# Patient Record
Sex: Male | Born: 1995 | Race: Black or African American | Hispanic: No | Marital: Single | Smoking: Current every day smoker
Health system: Southern US, Community
[De-identification: ages and names within clinical notes are randomized; demographics above are authoritative.]

## PROBLEM LIST (undated history)

## (undated) DIAGNOSIS — B009 Herpesviral infection, unspecified: Secondary | ICD-10-CM

## (undated) DIAGNOSIS — F419 Anxiety disorder, unspecified: Secondary | ICD-10-CM

## (undated) DIAGNOSIS — F32A Depression, unspecified: Secondary | ICD-10-CM

## (undated) DIAGNOSIS — K219 Gastro-esophageal reflux disease without esophagitis: Secondary | ICD-10-CM

## (undated) DIAGNOSIS — J449 Chronic obstructive pulmonary disease, unspecified: Secondary | ICD-10-CM

## (undated) HISTORY — DX: Gastro-esophageal reflux disease without esophagitis: K21.9

## (undated) HISTORY — DX: Chronic obstructive pulmonary disease, unspecified: J44.9

## (undated) HISTORY — DX: Depression, unspecified: F32.A

---

## 2018-02-28 ENCOUNTER — Ambulatory Visit (INDEPENDENT_AMBULATORY_CARE_PROVIDER_SITE_OTHER): Payer: Self-pay | Admitting: Family Medicine

## 2018-02-28 ENCOUNTER — Encounter: Payer: Self-pay | Admitting: Family Medicine

## 2018-02-28 VITALS — BP 135/90 | HR 78 | Resp 17 | Ht 63.0 in | Wt 125.0 lb

## 2018-02-28 DIAGNOSIS — Z7689 Persons encountering health services in other specified circumstances: Secondary | ICD-10-CM

## 2018-02-28 DIAGNOSIS — Z113 Encounter for screening for infections with a predominantly sexual mode of transmission: Secondary | ICD-10-CM

## 2018-02-28 DIAGNOSIS — Z202 Contact with and (suspected) exposure to infections with a predominantly sexual mode of transmission: Secondary | ICD-10-CM

## 2018-02-28 MED ORDER — CEFTRIAXONE SODIUM 250 MG IJ SOLR
250.0000 mg | Freq: Once | INTRAMUSCULAR | Status: AC
  Administered 2018-02-28: 250 mg via INTRAMUSCULAR

## 2018-02-28 MED ORDER — AZITHROMYCIN 500 MG PO TABS
1000.0000 mg | ORAL_TABLET | Freq: Once | ORAL | Status: AC
  Administered 2018-02-28: 1000 mg via ORAL

## 2018-02-28 NOTE — Patient Instructions (Addendum)
Thank you for choosing Primary Care at [facility] to be your medical home!    William Caldwell was seen by William Courts, William Caldwell today.   William Caldwell's primary care provider is William Neighbors, William Caldwell.   For the best care possible, you should try to see William Courts, William Caldwell whenever you come to the clinic.   We look forward to seeing you again soon!  If you have any questions about your visit today, please call us at [phone] or feel free to reach your primary care provider via MyChart.     Sexually Transmitted Disease A sexually transmitted disease (STD) is a disease or infection that may be passed (transmitted) from person to person, usually during sexual activity. This may happen by way of saliva, semen, blood, vaginal mucus, or urine. Common STDs include:  Gonorrhea.  Chlamydia.  Syphilis.  HIV and AIDS.  Genital herpes.  Hepatitis B and C.  Trichomonas.  Human papillomavirus (HPV).  Pubic lice.  Scabies.  Mites.  Bacterial vaginosis.  What are the causes? An STD may be caused by bacteria, a virus, or parasites. STDs are often transmitted during sexual activity if one person is infected. However, they may also be transmitted through nonsexual means. STDs may be transmitted after:  Sexual intercourse with an infected person.  Sharing sex toys with an infected person.  Sharing needles with an infected person or using unclean piercing or tattoo needles.  Having intimate contact with the genitals, mouth, or rectal areas of an infected person.  Exposure to infected fluids during birth.  What are the signs or symptoms? Different STDs have different symptoms. Some people may not have any symptoms. If symptoms are present, they may include:  Painful or bloody urination.  Pain in the pelvis, abdomen, vagina, anus, throat, or eyes.  A skin rash, itching, or irritation.  Growths, ulcerations, blisters, or sores in the genital and anal areas.  Abnormal  vaginal discharge with or without bad odor.  Penile discharge in men.  Fever.  Pain or bleeding during sexual intercourse.  Swollen glands in the groin area.  Yellow skin and eyes (jaundice). This is seen with hepatitis.  Swollen testicles.  Infertility.  Sores and blisters in the mouth.  How is this diagnosed? To make a diagnosis, your health care provider may:  Take a medical history.  Perform a physical exam.  Take a sample of any discharge to examine.  Swab the throat, cervix, opening to the penis, rectum, or vagina for testing.  Test a sample of your first morning urine.  Perform blood tests.  Perform a Pap test, if this applies.  Perform a colposcopy.  Perform a laparoscopy.  How is this treated? Treatment depends on the STD. Some STDs may be treated but not cured.  Chlamydia, gonorrhea, trichomonas, and syphilis can be cured with antibiotic medicine.  Genital herpes, hepatitis, and HIV can be treated, but not cured, with prescribed medicines. The medicines lessen symptoms.  Genital warts from HPV can be treated with medicine or by freezing, burning (electrocautery), or surgery. Warts may come back.  HPV cannot be cured with medicine or surgery. However, abnormal areas may be removed from the cervix, vagina, or vulva.  If your diagnosis is confirmed, your recent sexual partners need treatment. This is true even if they are symptom-free or have a negative culture or evaluation. They should not have sex until their health care providers say it is okay.  Your health care provider may test you for  infection again 3 months after treatment.  How is this prevented? Take these steps to reduce your risk of getting an STD:  Use latex condoms, dental dams, and water-soluble lubricants during sexual activity. Do not use petroleum jelly or oils.  Avoid having multiple sex partners.  Do not have sex with someone who has other sex partners.  Do not have sex with  anyone you do not know or who is at high risk for an STD.  Avoid risky sex practices that can break your skin.  Do not have sex if you have open sores on your mouth or skin.  Avoid drinking too much alcohol or taking illegal drugs. Alcohol and drugs can affect your judgment and put you in a vulnerable position.  Avoid engaging in oral and anal sex acts.  Get vaccinated for HPV and hepatitis. If you have not received these vaccines in the past, talk to your health care provider about whether one or both might be right for you.  If you are at risk of being infected with HIV, it is recommended that you take a prescription medicine daily to prevent HIV infection. This is called pre-exposure prophylaxis (PrEP). You are considered at risk if: ? You are a man who has sex with other men (MSM). ? You are a heterosexual man or woman and are sexually active with more than one partner. ? You take drugs by injection. ? You are sexually active with a partner who has HIV.  Talk with your health care provider about whether you are at high risk of being infected with HIV. If you choose to begin PrEP, you should first be tested for HIV. You should then be tested every 3 months for as long as you are taking PrEP.  Contact a health care provider if:  See your health care provider.  Tell your sexual partner(s). They should be tested and treated for any STDs.  Do not have sex until your health care provider says it is okay. Get help right away if: Contact your health care provider right away if:  You have severe abdominal pain.  You are a man and notice swelling or pain in your testicles.  You are a woman and notice swelling or pain in your vagina.  This information is not intended to replace advice given to you by your health care provider. Make sure you discuss any questions you have with your health care provider. Document Released: 05/28/2002 Document Revised: 09/25/2015 Document Reviewed:  09/25/2012 Elsevier Interactive Patient Education  2018 [employer]

## 2018-02-28 NOTE — Progress Notes (Signed)
William Caldwell, is a 22 y.o. male  [id number]:[id number]:[id number]  [id number]:[id number]  DOB - 21-Jul-1995  CC:  Chief Complaint  Patient presents with  . Establish Care  . Exposure to STD       HPI: William Caldwell is a 22 y.o. male is here today to establish care.   William Caldwell does not have a problem list on file.    Today's visit:  William Caldwell is concern for STD. Reports exposure to Chlamydia and wishes to be treated.   Initially declined in front of partner HIV and RPR testing today. However, while leaving urine specimen, opted to have additional screenings completed. Asymptomatic. Partner tested positive for Chlamydia 3 days ago and was already treated at the health department. He complains of dysuria and genital itching.  Patient denies new headaches, chest pain, abdominal pain, nausea, new weakness , numbness or tingling, SOB, edema, or worrisome cough.   Current medications:No current outpatient medications on file.  Current Facility-Administered Medications:  .  azithromycin (ZITHROMAX) tablet 1,000 mg, 1,000 mg, Oral, Once, Tiburcio PeaHarris, Godfrey PickKimberly S, FNP .  cefTRIAXone (ROCEPHIN) injection 250 mg, 250 mg, Intramuscular, Once, Tiburcio PeaHarris, Godfrey PickKimberly S, FNP   Pertinent family medical history: family history is not on file.   No Known Allergies  Social History   Socioeconomic History  . Marital status: Single    Spouse name: Not on file  . Number of children: Not on file  . Years of education: Not on file  . Highest education level: Not on file  Occupational History  . Not on file  Social Needs  . Financial resource strain: Not on file  . Food insecurity:    Worry: Not on file    Inability: Not on file  . Transportation needs:    Medical: Not on file    Non-medical: Not on file  Tobacco Use  . Smoking status: Never Smoker  . Smokeless tobacco: Never Used  Substance and Sexual Activity  . Alcohol use: Not Currently    Frequency: Never  . Drug use: Never  . Sexual activity: Not on file   Lifestyle  . Physical activity:    Days per week: Not on file    Minutes per session: Not on file  . Stress: Not on file  Relationships  . Social connections:    Talks on phone: Not on file    Gets together: Not on file    Attends religious service: Not on file    Active member of club or organization: Not on file    Attends meetings of clubs or organizations: Not on file    Relationship status: Not on file  . Intimate partner violence:    Fear of current or ex partner: Not on file    Emotionally abused: Not on file    Physically abused: Not on file    Forced sexual activity: Not on file  Other Topics Concern  . Not on file  Social History Narrative  . Not on file    Review of Systems: Pertinent negatives listed in HPI  Objective:   Vitals:   02/28/18 1501  BP: 135/90  Pulse: 78  Resp: 17  SpO2: 98%    BP Readings from Last 3 Encounters:  02/28/18 135/90    Filed Weights   02/28/18 1501  Weight: 125 lb (56.7 kg)      Physical Exam: Constitutional: Patient appears well-developed and well-nourished. No distress. HENT: Normocephalic, atraumatic, External right and left ear normal. Oropharynx is clear and moist.  Eyes: Conjunctivae and EOM are normal. PERRLA, no scleral icterus. Neck: Normal ROM. Neck supple. No JVD. No tracheal deviation. No thyromegaly. Pulmonary: Effort and breath sounds normal, no stridor, rhonchi, wheezes, rales.  Abdominal: Soft. BS +, no distension, tenderness, rebound or guarding.  Musculoskeletal: Normal range of motion. No edema and no tenderness.  Skin: Skin is warm and dry. No rash noted. Not diaphoretic. No erythema. No pallor. Psychiatric: Normal mood and affect. Behavior, judgment, thought content normal.    Assessment and plan:  1. Encounter to establish care   2. STD exposure - GC/Chlamydia Probe Amp Treated with Rocephin 250 mg IM and Azithromycin 1,000 mg Avoid sexual intercourse for at least 3 weeks. Use barrier  protection.   Meds ordered this encounter  Medications  . azithromycin (ZITHROMAX) tablet 1,000 mg  . cefTRIAXone (ROCEPHIN) injection 250 mg   Orders Placed This Encounter  Procedures  . GC/Chlamydia Probe Amp  . HIV antibody (with reflex)  . RPR  . HSV(herpes simplex vrs) 1+2 ab-IgG    Follow-up as needed.  The patient was given clear instructions to go to ER or return to medical center if symptoms don't improve, worsen or new problems develop. The patient verbalized understanding. The patient was advised  to call and obtain lab results if they haven't heard anything from out office within 7-10 business days.   Joaquin Courts, FNP Primary Care at [facility] [address], [place] [address] [phone]: [phone]    This note has been created with Dragon speech recognition software and Paediatric nurse. Any transcriptional errors are unintentional.

## 2018-03-07 ENCOUNTER — Telehealth: Payer: Self-pay | Admitting: Family Medicine

## 2018-03-07 LAB — RPR, QUANT+TP ABS (REFLEX)
Rapid Plasma Reagin, Quant: 1:1 {titer} — ABNORMAL HIGH
T Pallidum Abs: NONREACTIVE

## 2018-03-07 LAB — HSV(HERPES SIMPLEX VRS) I + II AB-IGG
HSV 1 Glycoprotein G Ab, IgG: 37.3 index — ABNORMAL HIGH (ref 0.00–0.90)
HSV 2 IGG, TYPE SPEC: 1.82 {index} — AB (ref 0.00–0.90)

## 2018-03-07 LAB — HIV ANTIBODY (ROUTINE TESTING W REFLEX): HIV Screen 4th Generation wRfx: NONREACTIVE

## 2018-03-07 LAB — HSV-2 IGG SUPPLEMENTAL TEST: HSV-2 IgG Supplemental Test: NEGATIVE

## 2018-03-07 LAB — RPR: RPR Ser Ql: REACTIVE — AB

## 2018-03-07 NOTE — Telephone Encounter (Signed)
Please contact [facility] to notify of positive syphilis result. I will contact patient have him go to the health department for treatment

## 2018-03-07 NOTE — Telephone Encounter (Signed)
Please try and contact patient tomorrow. If no response, mail unable to contact letter.

## 2018-03-07 NOTE — Telephone Encounter (Signed)
Communicable disease report [phone]([phone]).

## 2018-03-08 NOTE — Telephone Encounter (Signed)
Patient answered phone & then hung up. Sounded like he was still sleep. Will try to call back later.

## 2018-03-08 NOTE — Telephone Encounter (Signed)
Spoke with patient & went over lab results. Expressed importance of him going to the Health Department for treatment. Patient hung up on me.

## 2018-03-09 LAB — GC/CHLAMYDIA PROBE AMP
CHLAMYDIA, DNA PROBE: POSITIVE — AB
Neisseria gonorrhoeae by PCR: NEGATIVE

## 2018-03-09 NOTE — Telephone Encounter (Signed)
Health Department called to notify us that RPR was a false positive since the t. pallidum antibody was nonreactive.

## 2018-03-09 NOTE — Progress Notes (Signed)
Communicable Disease Report faxed to the Health Department.

## 2018-03-10 ENCOUNTER — Emergency Department (HOSPITAL_COMMUNITY)
Admission: EM | Admit: 2018-03-10 | Discharge: 2018-03-10 | Disposition: A | Discharge status: Home / Self Care | Payer: Self-pay | Attending: Emergency Medicine | Admitting: Emergency Medicine

## 2018-03-10 ENCOUNTER — Encounter (HOSPITAL_COMMUNITY): Payer: Self-pay | Admitting: Emergency Medicine

## 2018-03-10 ENCOUNTER — Other Ambulatory Visit: Payer: Self-pay

## 2018-03-10 DIAGNOSIS — A539 Syphilis, unspecified: Secondary | ICD-10-CM | POA: Insufficient documentation

## 2018-03-10 DIAGNOSIS — F17200 Nicotine dependence, unspecified, uncomplicated: Secondary | ICD-10-CM | POA: Insufficient documentation

## 2018-03-10 MED ORDER — PENICILLIN G BENZATHINE 1200000 UNIT/2ML IM SUSP
2.4000 10*6.[IU] | Freq: Once | INTRAMUSCULAR | Status: AC
  Administered 2018-03-10: 2.4 10*6.[IU] via INTRAMUSCULAR
  Filled 2018-03-10: qty 4

## 2018-03-10 NOTE — ED Provider Notes (Signed)
[facility]   CSN: [id number] Arrival date & time: 03/10/18  1322     History   Chief Complaint Chief Complaint  Patient presents with  . SEXUALLY TRANSMITTED DISEASE    HPI William Caldwell is a 22 y.o. male who presents for treatment of syphilis.  Patient states that he had his girlfriend was diagnosed with chlamydia.  He underwent testing which is available and review of the EMR and was told he needed to come in for treatment.  Patient had positive RPR titers upon review with negative HIV testing.  He denies any active symptoms at this time.  Patient states he was in prison but his been out for the past 6 months.  He states when he got out he had sex with multiple women but he has been in a monogamous relationship for the past 4 months.  Patient states that he did not have any intercourse while in prison and has sex only with women.  He denies fevers chills or rapid weight loss. HPI  History reviewed. No pertinent past medical history.  There are no active problems to display for this patient.   History reviewed. No pertinent surgical history.      Home Medications    Prior to Admission medications   Not on File    Family History No family history on file.  Social History Social History   Tobacco Use  . Smoking status: Current Every Day Smoker    Packs/day: 1.00  . Smokeless tobacco: Never Used  Substance Use Topics  . Alcohol use: Not Currently    Frequency: Never    Comment: occassional  . Drug use: Not Currently     Allergies   Patient has no known allergies.   Review of Systems Review of Systems  Ten systems reviewed and are negative for acute change, except as noted in the HPI.   Physical Exam Updated Vital Signs BP 131/86   Pulse 91   Temp 99.3 F (37.4 C) (Oral)   Resp 16   SpO2 98%   Physical Exam Vitals signs and nursing note reviewed.  Constitutional:      General: He is not in  acute distress.    Appearance: He is well-developed. He is not diaphoretic.  HENT:     Head: Normocephalic and atraumatic.  Eyes:     General: No scleral icterus.    Conjunctiva/sclera: Conjunctivae normal.  Neck:     Musculoskeletal: Normal range of motion and neck supple.  Cardiovascular:     Rate and Rhythm: Normal rate and regular rhythm.     Heart sounds: Normal heart sounds.  Pulmonary:     Effort: Pulmonary effort is normal. No respiratory distress.     Breath sounds: Normal breath sounds.  Abdominal:     Palpations: Abdomen is soft.     Tenderness: There is no abdominal tenderness.  Skin:    General: Skin is warm and dry.  Neurological:     Mental Status: He is alert.  Psychiatric:        Behavior: Behavior normal.      ED Treatments / Results  Labs (all labs ordered are listed, but only abnormal results are displayed) Labs Reviewed  HIV-1 RNA QUANT-NO REFLEX-BLD    EKG None  Radiology No results found.  Procedures Procedures (including critical care time)  Medications Ordered in ED Medications  penicillin g benzathine (BICILLIN LA) 1200000 UNIT/2ML injection 2.4 Million Units (2.4 Million Units  Intramuscular Given 03/10/18 1407)     Initial Impression / Assessment and Plan / ED Course  I have reviewed the triage vital signs and the nursing notes.  Pertinent labs & imaging results that were available during my care of the patient were reviewed by me and considered in my medical decision making (see chart for details).     Patient with positive syphilis.  Treated here with 2,400,000 units of penicillin G.  I discussed the case with Dr. Orvan Falconerampbell is on-call for infectious disease.  I ordered an HIV viral load for discussion with Dr. Orvan Falconerampbell as patient HIV antibody was negative however in the setting of acute infection he may not yet have developed antibodies.  This will be followed up with by infectious disease doctors.  We discussed safe sex practices.   Discussed return precautions and outpatient follow-up.  Patient appears appropriate for discharge at this time  Final Clinical Impressions(s) / ED Diagnoses   Final diagnoses:  None    ED Discharge Orders    None       Arthor CaptainHarris, Promyse Ardito, PA-C 03/10/18 1542    Doug SouJacubowitz, Sam, MD 03/10/18 [id number]

## 2018-03-10 NOTE — ED Triage Notes (Addendum)
Pt reports sudden weight loss starting 2 months ago, generalized weakness, abd pain, being told on Thursday that pt is positive for syphilis. Pt reports finishing tx for chlamydia on Wednesday.  Pt reports recent incarceration.

## 2018-03-10 NOTE — Discharge Instructions (Signed)
DO NOT HAVE UNPROTECTED INTERCOURSE INCLUDING TOUCHING ANOTHER PERSON'S GENITALS, ORAL, ANAL OR VAGINAL INTERCOURSE FOR THE NEXT 10 DAYS.   Get help right away if: You have severe chest pain. You have trouble walking or coordinating movements. You are confused. You lose vision or hearing. You have numbness in your arms or legs. You have a seizure. You faint. You have a severe headache that does not go away with medicine.  Free HIV and STD Testing These locations offer FREE confidential testing for HIV, Chlamydia, Gonorrhea, and Syphilis. Non-Traditional Testing Sites Address Telephone  [facility] [address], [place] [phone] Mondays 5pm - 7pm  [facility] [address] [place] [phone] Wednesdays 2pm-8pm  [facility] [address], [place] [phone] Thursdays 9am-12noon 1pm-4pm  [facility] [address], [place] [phone] Tuesdays Thursdays 9am-12noon 1pm-4pm  [facility] offers free, confidential testing and treatment for HIV, Chlamydia, Gonorrhea, Syphilis, Herpes, Bacterial Vaginosis, Yeast, and Trichomoniasis. Traditional Testing   [facility] [address], [place] [phone]  Monday thru Friday  Call for an appointment  [facility] [address] [place] 239 159 [phone] Monday thru Friday  Call for anappointment.  If you have any questions about this information please call [phone]. 01/27/2011

## 2018-03-11 LAB — HIV-1 RNA QUANT-NO REFLEX-BLD
HIV 1 RNA Quant: <20 copies/mL
LOG10 HIV-1 RNA: UNDETERMINED {Log_copies}/mL

## 2018-05-01 ENCOUNTER — Emergency Department (HOSPITAL_COMMUNITY)
Admission: EM | Admit: 2018-05-01 | Discharge: 2018-05-01 | Disposition: A | Discharge status: Home / Self Care | Payer: Self-pay | Attending: Emergency Medicine | Admitting: Emergency Medicine

## 2018-05-01 ENCOUNTER — Encounter (HOSPITAL_COMMUNITY): Payer: Self-pay

## 2018-05-01 DIAGNOSIS — F10120 Alcohol abuse with intoxication, uncomplicated: Secondary | ICD-10-CM | POA: Insufficient documentation

## 2018-05-01 DIAGNOSIS — Z5321 Procedure and treatment not carried out due to patient leaving prior to being seen by health care provider: Secondary | ICD-10-CM | POA: Insufficient documentation
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
## 2018-12-06 ENCOUNTER — Emergency Department (HOSPITAL_COMMUNITY): Payer: Self-pay

## 2018-12-06 ENCOUNTER — Emergency Department (HOSPITAL_COMMUNITY)
Admission: EM | Admit: 2018-12-06 | Discharge: 2018-12-06 | Disposition: A | Discharge status: Home / Self Care | Payer: Self-pay | Attending: Emergency Medicine | Admitting: Emergency Medicine

## 2018-12-06 ENCOUNTER — Encounter (HOSPITAL_COMMUNITY): Payer: Self-pay | Admitting: *Deleted

## 2018-12-06 DIAGNOSIS — F17210 Nicotine dependence, cigarettes, uncomplicated: Secondary | ICD-10-CM | POA: Insufficient documentation

## 2018-12-06 DIAGNOSIS — M79674 Pain in right toe(s): Secondary | ICD-10-CM | POA: Insufficient documentation

## 2018-12-06 MED ORDER — NAPROXEN 500 MG PO TABS: 500.0000 mg | ORAL_TABLET | Freq: Two times a day (BID) | ORAL | 0 refills | Status: DC

## 2018-12-06 NOTE — Discharge Instructions (Signed)
Please read and follow all provided instructions.  Your diagnoses today include:  1. Pain of right great toe     Tests performed today include:  An x-ray of the affected area - does NOT show any broken bones or other problems  Vital signs. See below for your results today.   Medications prescribed:   Naproxen - anti-inflammatory pain medication  Do not exceed 500mg  naproxen every 12 hours, take with food  You have been prescribed an anti-inflammatory medication or NSAID. Take with food. Take smallest effective dose for the shortest duration needed for your pain. Stop taking if you experience stomach pain or vomiting.   Take any prescribed medications only as directed.  Home care instructions:   Follow any educational materials contained in this packet  Follow R.I.C.E. Protocol:  R - rest your injury   I  - use ice on injury without applying directly to skin  C - compress injury with bandage or splint  E - elevate the injury as much as possible  Follow-up instructions: Please follow-up with your primary care provider or the provided orthopedic physician (bone specialist) if you continue to have significant pain in 1 week. In this case you may have a more severe injury that requires further care.   Return instructions:   Please return if your toes or feet are numb or tingling, appear gray or blue, or you have severe pain (also elevate the leg and loosen splint or wrap if you were given one)  Please return to the Emergency Department if you experience worsening symptoms.   Please return if you have any other emergent concerns.  Additional Information:  Your vital signs today were: BP (!) 139/100 (BP Location: Right Arm)    Pulse 69    Temp 97.9 F (36.6 C) (Oral)    Resp 17    Ht 5\' 3"  (1.6 m)    Wt 58.1 kg    SpO2 100%    BMI 22.67 kg/m  If your blood pressure (BP) was elevated above 135/85 this visit, please have this repeated by your doctor within one  month. --------------

## 2018-12-06 NOTE — ED Triage Notes (Signed)
C/o right foot pain x 1 month no change.

## 2018-12-06 NOTE — ED Provider Notes (Signed)
[facility] Note   CSN: [id number] Arrival date & time: 12/06/18  [id number]     History   Chief Complaint Chief Complaint  Patient presents with  . Foot Pain    HPI William Caldwell is a 23 y.o. male.     Patient presents the emergency department today with chief complaint of right foot pain.  Patient has had pain at the right great toe for approximately the past 1 month.  He denies any injury at onset.  No numbness or tingling.  No previous history of arthritis or gout.  He states that the area sometimes swollen but has not been red.  Pain is worse with walking and pain is affected his gait.  No pain over the bottom of the foot.  No pain in the ankle, knee, or hip.  No fevers, nausea or vomiting.  He has not been evaluated for this complaint in the past.  Patient does have a history of syphilis.  He denies other joint involvement.     History reviewed. No pertinent past medical history.  There are no active problems to display for this patient.   History reviewed. No pertinent surgical history.      Home Medications    Prior to Admission medications   Not on File    Family History No family history on file.  Social History Social History   Tobacco Use  . Smoking status: Current Every Day Smoker    Packs/day: 1.00  . Smokeless tobacco: Never Used  Substance Use Topics  . Alcohol use: Not Currently    Frequency: Never    Comment: occassional  . Drug use: Not Currently     Allergies   Patient has no known allergies.   Review of Systems Review of Systems  Constitutional: Negative for activity change.  Musculoskeletal: Positive for arthralgias, gait problem and joint swelling. Negative for back pain and neck pain.  Skin: Negative for wound.  Neurological: Negative for weakness and numbness.     Physical Exam Updated Vital Signs BP (!) 139/100 (BP Location: Right Arm)   Pulse 69   Temp 97.9 F (36.6 C)  (Oral)   Resp 17   Ht 5\' 3"  (1.6 m)   Wt 58.1 kg   SpO2 100%   BMI 22.67 kg/m   Physical Exam Vitals signs and nursing note reviewed.  Constitutional:      Appearance: He is well-developed.  HENT:     Head: Normocephalic and atraumatic.  Eyes:     Conjunctiva/sclera: Conjunctivae normal.  Neck:     Musculoskeletal: Normal range of motion and neck supple.  Cardiovascular:     Pulses: Normal pulses. No decreased pulses.  Musculoskeletal:        General: Tenderness present.       Feet:  Skin:    General: Skin is warm and dry.  Neurological:     Mental Status: He is alert.     Sensory: No sensory deficit.     Comments: Motor, sensation, and vascular distal to the injury is fully intact.       ED Treatments / Results  Labs (all labs ordered are listed, but only abnormal results are displayed) Labs Reviewed - No data to display  EKG None  Radiology No results found.  Procedures Procedures (including critical care time)  Medications Ordered in ED Medications - No data to display   Initial Impression / Assessment and Plan / ED Course  I have  reviewed the triage vital signs and the nursing notes.  Pertinent labs & imaging results that were available during my care of the patient were reviewed by me and considered in my medical decision making (see chart for details).        Patient seen and examined.  Will obtain x-ray of the foot.  If negative, will treat with anti-inflammatories and have patient follow-up with podiatry.  Patient has used alcohol in the past.  Discussed avoidance of alcohol with use of NSAIDs.  Vital signs reviewed and are as follows: BP (!) 139/100 (BP Location: Right Arm)   Pulse 69   Temp 97.9 F (36.6 C) (Oral)   Resp 17   Ht 5\' 3"  (1.6 m)   Wt 58.1 kg   SpO2 100%   BMI 22.67 kg/m     Final Clinical Impressions(s) / ED Diagnoses   Final diagnoses:  None   Patient with pain in the right foot, confined to the right great  toe.  Minimal swelling without redness or warmth.  This possibly could be gout or arthritis.  Doubt joint infection.  No overlying wounds or history of diabetes or other immunocompromising conditions to suggest osteomyelitis.   ED Discharge Orders         Ordered    naproxen (NAPROSYN) 500 MG tablet  2 times daily     12/06/18 0938           Carlisle Cater, PA-C 12/06/18 1624    Wyvonnia Dusky, MD 12/06/18 2055

## 2019-03-06 ENCOUNTER — Emergency Department (HOSPITAL_COMMUNITY)
Admission: EM | Admit: 2019-03-06 | Discharge: 2019-03-06 | Disposition: A | Discharge status: Home / Self Care | Payer: Self-pay | Attending: Emergency Medicine | Admitting: Emergency Medicine

## 2019-03-06 ENCOUNTER — Emergency Department (HOSPITAL_COMMUNITY): Payer: Self-pay

## 2019-03-06 ENCOUNTER — Encounter (HOSPITAL_COMMUNITY): Payer: Self-pay | Admitting: Emergency Medicine

## 2019-03-06 ENCOUNTER — Other Ambulatory Visit: Payer: Self-pay

## 2019-03-06 DIAGNOSIS — F17210 Nicotine dependence, cigarettes, uncomplicated: Secondary | ICD-10-CM | POA: Insufficient documentation

## 2019-03-06 DIAGNOSIS — N452 Orchitis: Secondary | ICD-10-CM | POA: Insufficient documentation

## 2019-03-06 HISTORY — DX: Herpesviral infection, unspecified: B00.9

## 2019-03-06 LAB — URINALYSIS, ROUTINE W REFLEX MICROSCOPIC
Bacteria, UA: NONE SEEN
Bilirubin Urine: NEGATIVE
Glucose, UA: NEGATIVE mg/dL
Hgb urine dipstick: NEGATIVE
Ketones, ur: NEGATIVE mg/dL
Leukocytes,Ua: NEGATIVE
Nitrite: NEGATIVE
Protein, ur: 30 mg/dL — AB
Specific Gravity, Urine: 1.033 — ABNORMAL HIGH (ref 1.005–1.030)
pH: 6 (ref 5.0–8.0)

## 2019-03-06 LAB — HIV ANTIBODY (ROUTINE TESTING W REFLEX): HIV Screen 4th Generation wRfx: NONREACTIVE

## 2019-03-06 MED ORDER — LIDOCAINE HCL (PF) 1 % IJ SOLN
INTRAMUSCULAR | Status: AC
  Administered 2019-03-06: 15:00:00 2 mL
  Filled 2019-03-06: qty 5

## 2019-03-06 MED ORDER — LIDOCAINE HCL (PF) 1 % IJ SOLN
INTRAMUSCULAR | Status: AC
  Filled 2019-03-06: qty 5

## 2019-03-06 MED ORDER — AZITHROMYCIN 1 G PO PACK
1.0000 g | PACK | Freq: Once | ORAL | Status: AC
  Administered 2019-03-06: 15:00:00 1 g via ORAL
  Filled 2019-03-06: qty 1

## 2019-03-06 MED ORDER — CEFTRIAXONE SODIUM 250 MG IJ SOLR
250.0000 mg | Freq: Once | INTRAMUSCULAR | Status: AC
  Administered 2019-03-06: 250 mg via INTRAMUSCULAR
  Filled 2019-03-06: qty 250

## 2019-03-06 NOTE — ED Provider Notes (Signed)
[facility] EMERGENCY DEPARTMENT Provider Note   CSN: [id number] Arrival date & time: 03/06/19  1147     History Chief Complaint  Patient presents with  . Testicle Pain    William Caldwell is a 23 y.o. male.  Patient is a 23 year old male with PMH significant for HSV1, HSV2, and Syphillis. Patient arrives to the ED with 1 day of orchitis without swelling or penile discharge. Denies fever. No meds PTA. Sexually active, doesn't use protection. Multiple sex partners. No other medical history and does not take daily medications.         Past Medical History:  Diagnosis Date  . Herpes     There are no problems to display for this patient.   History reviewed. No pertinent surgical history.     No family history on file.  Social History   Tobacco Use  . Smoking status: Current Every Day Smoker    Packs/day: 1.00  . Smokeless tobacco: Never Used  Substance Use Topics  . Alcohol use: Not Currently    Comment: occassional  . Drug use: Not Currently    Home Medications Prior to Admission medications   Medication Sig Start Date End Date Taking? Authorizing Provider  naproxen (NAPROSYN) 500 MG tablet Take 1 tablet (500 mg total) by mouth 2 (two) times daily. 12/06/18   Carlisle Cater, PA-C    Allergies    Patient has no known allergies.  Review of Systems   Review of Systems  Constitutional: Negative for fever.  HENT: Negative for mouth sores, sinus pressure and trouble swallowing.   Eyes: Negative for pain.  Respiratory: Negative for cough, chest tightness and shortness of breath.   Cardiovascular: Negative for chest pain.  Gastrointestinal: Negative for abdominal pain, diarrhea, nausea and vomiting.  Genitourinary: Positive for testicular pain. Negative for discharge, dysuria and genital sores.  Musculoskeletal: Negative for neck stiffness.  Skin: Negative for rash.  Neurological: Negative for dizziness.  Hematological: Negative for  adenopathy.    Physical Exam Updated Vital Signs BP (!) 159/109 (BP Location: Left Arm)   Pulse 95   Temp 98.8 F (37.1 C) (Oral)   Resp 18   SpO2 98%   Physical Exam Exam conducted with a chaperone present.  Constitutional:      Appearance: He is normal weight.  HENT:     Head: Normocephalic and atraumatic.     Right Ear: Tympanic membrane, ear canal and external ear normal.     Left Ear: Tympanic membrane, ear canal and external ear normal.     Nose: Nose normal.     Mouth/Throat:     Mouth: Mucous membranes are moist.     Pharynx: Oropharynx is clear.  Eyes:     Extraocular Movements: Extraocular movements intact.     Conjunctiva/sclera: Conjunctivae normal.     Pupils: Pupils are equal, round, and reactive to light.  Cardiovascular:     Rate and Rhythm: Regular rhythm.     Pulses: Normal pulses.     Heart sounds: Normal heart sounds.  Pulmonary:     Effort: Pulmonary effort is normal.     Breath sounds: Normal breath sounds.  Abdominal:     General: Abdomen is flat. Bowel sounds are normal.     Palpations: Abdomen is soft.     Tenderness: There is abdominal tenderness (reports dull lower abd pain bilaterally).  Genitourinary:    Pubic Area: No rash or pubic lice.      Penis: Normal  and circumcised. No erythema, discharge, swelling or lesions.      Testes: Cremasteric reflex is present.        Right: Tenderness present. Swelling not present.        Left: Tenderness present. Swelling not present.     Epididymis:     Right: Normal.     Left: Normal.     Tanner stage (genital): 5.     Comments: Patient reports tenderness to palpation to each testicle. No penile discharge, no inguinal hernia noted. Testicles are the same in size, no masses palpated.  Musculoskeletal:        General: Normal range of motion.     Cervical back: Normal range of motion and neck supple.  Skin:    General: Skin is warm and dry.     Capillary Refill: Capillary refill takes less than 2  seconds.  Neurological:     General: No focal deficit present.     Mental Status: He is alert and oriented to person, place, and time. Mental status is at baseline.  Psychiatric:        Mood and Affect: Mood normal.     ED Results / Procedures / Treatments   Labs (all labs ordered are listed, but only abnormal results are displayed) Labs Reviewed  URINALYSIS, ROUTINE W REFLEX MICROSCOPIC - Abnormal; Notable for the following components:      Result Value   Specific Gravity, Urine 1.033 (*)    Protein, ur 30 (*)    All other components within normal limits  RPR  HIV ANTIBODY (ROUTINE TESTING W REFLEX)  GC/CHLAMYDIA PROBE AMP ([facility]) NOT AT [facility]    EKG None  Radiology US SCROTUM W/DOPPLER  Result Date: 03/06/2019 CLINICAL DATA:  Right scrotal region pain for 1 week EXAM: SCROTAL ULTRASOUND DOPPLER ULTRASOUND OF THE TESTICLES TECHNIQUE: Complete ultrasound examination of the testicles, epididymis, and other scrotal structures was performed. Color and spectral Doppler ultrasound were also utilized to evaluate blood flow to the testicles. COMPARISON:  None. FINDINGS: Right testicle Measurements: 3.8 x 2.0 x 2.5 cm. No mass or microlithiasis visualized. Left testicle Measurements: 3.4 x 2.0 x 2.2 cm. No mass or microlithiasis visualized. Right epididymis:  Normal in size and appearance. Left epididymis:  Normal in size and appearance. Hydrocele:  None visualized. Varicocele:  None visualized. Pulsed Doppler interrogation of both testes demonstrates normal low resistance arterial and venous waveforms bilaterally. No scrotal wall thickening or scrotal abscess on either side. IMPRESSION: No abnormality noted. No intratesticular or extratesticular mass or inflammatory focus on either side. No testicular torsion on either side. Electronically Signed   By: Bretta Bang III M.D.   On: 03/06/2019 14:06    Procedures Procedures (including critical care time)  Medications Ordered in  ED Medications  cefTRIAXone (ROCEPHIN) injection 250 mg (has no administration in time range)  azithromycin (ZITHROMAX) powder 1 g (has no administration in time range)    ED Course  I have reviewed the triage vital signs and the nursing notes.  Pertinent labs & imaging results that were available during my care of the patient were reviewed by me and considered in my medical decision making (see chart for details).    MDM Rules/Calculators/A&P                      Given presentation with constant testicular pain, [place] ordered to r/o torsion and was negative. Obtained serial STI labs along with GC swab. Syphilis labs obtained due  to history and poor follow up with PCP. Also obtained HIV labs.   Given US negative and patient partakes in high risk sexual activity, shared decision making to treat to cover for G/C. Patient informed someone will call with results of these tests.   Thoroughly discussed in detail safe sex practices and routine STI screenings. Also discussed PREP and encouraged patient to follow up with PCP to learn more about STI screenings and routine examinations. Return precautions provided to patient and he verbalized understanding of information. Stable for discharge. Routine follow encouraged.   Final Clinical Impression(s) / ED Diagnoses Final diagnoses:  Orchitis, bilateral    Rx / DC Orders ED Discharge Orders    None       Orma FlamingHouk, Shanta Hartner R, NP 03/06/19 1440    Blane OharaZavitz, Joshua, MD 03/09/19 [phone]

## 2019-03-06 NOTE — ED Notes (Signed)
Patient tested in my presence

## 2019-03-06 NOTE — ED Triage Notes (Signed)
Pt with R sided testicle pain that has been going on intermittently and came in today as the pain woke him up. No meds PTA. Pain 10/10.

## 2019-03-06 NOTE — ED Notes (Signed)
Patient transported to Ultrasound 

## 2019-03-07 LAB — RPR: RPR Ser Ql: NONREACTIVE

## 2019-03-07 LAB — GC/CHLAMYDIA PROBE AMP (~~LOC~~) NOT AT ARMC
Chlamydia: NEGATIVE
Neisseria Gonorrhea: NEGATIVE

## 2019-04-30 ENCOUNTER — Telehealth: Payer: Self-pay

## 2019-04-30 NOTE — Telephone Encounter (Signed)

## 2019-05-01 ENCOUNTER — Encounter: Payer: Self-pay | Admitting: Internal Medicine

## 2019-05-01 ENCOUNTER — Ambulatory Visit (INDEPENDENT_AMBULATORY_CARE_PROVIDER_SITE_OTHER): Payer: Self-pay | Admitting: Internal Medicine

## 2019-05-01 ENCOUNTER — Other Ambulatory Visit: Payer: Self-pay

## 2019-05-01 VITALS — BP 131/70 | HR 88 | Temp 97.3°F | Resp 17 | Ht 62.5 in | Wt 131.8 lb

## 2019-05-01 DIAGNOSIS — A6000 Herpesviral infection of urogenital system, unspecified: Secondary | ICD-10-CM

## 2019-05-01 DIAGNOSIS — Z113 Encounter for screening for infections with a predominantly sexual mode of transmission: Secondary | ICD-10-CM

## 2019-05-01 MED ORDER — VALACYCLOVIR HCL 500 MG PO TABS: 500.0000 mg | ORAL_TABLET | Freq: Every day | ORAL | 3 refills | Status: DC

## 2019-05-01 NOTE — Progress Notes (Signed)
  Subjective:    William Caldwell - 24 y.o. male MRN [id number]  Date of birth: [birth year]  HPI  William Caldwell is here for STD screening. Previous history of Chlamydia+ in 2019, treated. Also reports history of Syphilis and HSV. Has been having several herpes outbreaks this year. Reports he has had at least one in the past month or so. Knows the signs of prodromal symptoms prior to lesions appearing. Endorses some increased urinary frequency currently, otherwise feeling well. Reports he has made some risky sexual decisions lately and would like to get tested again. Had negative testing in December.       Health Maintenance:  There are no preventive care reminders to display for this patient.  -  reports that he has been smoking. He has been smoking about 1.00 pack per day. He has never used smokeless tobacco. - Review of Systems: Per HPI. - Past Medical History: There are no problems to display for this patient.  - Medications: reviewed and updated   Objective:   Physical Exam Temp (!) 97.3 F (36.3 C) (Temporal)   Resp 17   Ht 5' 2.5" (1.588 m)   Wt 131 lb 12.8 oz (59.8 kg)   BMI 23.72 kg/m  Physical Exam  Constitutional: He is oriented to person, place, and time and well-developed, well-nourished, and in no distress. No distress.  HENT:  Head: Normocephalic and atraumatic.  Eyes: Conjunctivae and EOM are normal.  Cardiovascular: Normal rate, regular rhythm and normal heart sounds.  No murmur heard. Pulmonary/Chest: Effort normal and breath sounds normal. No respiratory distress.  Musculoskeletal:        General: Normal range of motion.  Neurological: He is alert and oriented to person, place, and time.  Skin: Skin is warm and dry. He is not diaphoretic.  Psychiatric: Affect and judgment normal.           Assessment & Plan:   1. Screening for STDs (sexually transmitted diseases) Discussed safe sex practices. Patient would prefer not to be called if all results are  negative. Discussed no need to re-test for HSV.  - HIV antibody (with reflex) - RPR - GC/Chlamydia Probe Amp([facility])  2. Recurrent genital herpes Patient having more than 6 outbreaks per year; would be ideal candidate for daily suppressive therapy. Will start with Valtrex 500 mg daily, if still having outbreaks will increase to 1000 mg daily. Discussed in future will trial off as patient is just slightly over one year since first outbreak would expect outbreak frequency to reduce in time and then could use medication prn for outbreaks.  - valACYclovir (VALTREX) 500 MG tablet; Take 1 tablet (500 mg total) by mouth daily.  Dispense: 30 tablet; Refill: 3   Marcy Siren, D.O. 05/01/2019, 2:26 PM Primary Care at [facility]

## 2019-05-02 LAB — RPR: RPR Ser Ql: NONREACTIVE

## 2019-05-02 LAB — HIV ANTIBODY (ROUTINE TESTING W REFLEX): HIV Screen 4th Generation wRfx: NONREACTIVE

## 2019-05-03 LAB — GC/CHLAMYDIA PROBE AMP
Chlamydia trachomatis, NAA: NEGATIVE
Neisseria Gonorrhoeae by PCR: NEGATIVE

## 2019-07-31 ENCOUNTER — Emergency Department (HOSPITAL_COMMUNITY)
Admission: EM | Admit: 2019-07-31 | Discharge: 2019-07-31 | Disposition: A | Discharge status: Home / Self Care | Payer: Self-pay | Attending: Emergency Medicine | Admitting: Emergency Medicine

## 2019-07-31 ENCOUNTER — Other Ambulatory Visit: Payer: Self-pay

## 2019-07-31 ENCOUNTER — Emergency Department (HOSPITAL_COMMUNITY): Payer: Self-pay

## 2019-07-31 ENCOUNTER — Encounter (HOSPITAL_COMMUNITY): Payer: Self-pay | Admitting: Emergency Medicine

## 2019-07-31 DIAGNOSIS — R109 Unspecified abdominal pain: Secondary | ICD-10-CM | POA: Insufficient documentation

## 2019-07-31 DIAGNOSIS — F17210 Nicotine dependence, cigarettes, uncomplicated: Secondary | ICD-10-CM | POA: Insufficient documentation

## 2019-07-31 DIAGNOSIS — R7989 Other specified abnormal findings of blood chemistry: Secondary | ICD-10-CM | POA: Insufficient documentation

## 2019-07-31 DIAGNOSIS — Z79899 Other long term (current) drug therapy: Secondary | ICD-10-CM | POA: Insufficient documentation

## 2019-07-31 DIAGNOSIS — R350 Frequency of micturition: Secondary | ICD-10-CM | POA: Insufficient documentation

## 2019-07-31 DIAGNOSIS — N452 Orchitis: Secondary | ICD-10-CM | POA: Insufficient documentation

## 2019-07-31 HISTORY — DX: Anxiety disorder, unspecified: F41.9

## 2019-07-31 LAB — CBC
HCT: 43.8 % (ref 39.0–52.0)
Hemoglobin: 14.6 g/dL (ref 13.0–17.0)
MCH: 31.1 pg (ref 26.0–34.0)
MCHC: 33.3 g/dL (ref 30.0–36.0)
MCV: 93.2 fL (ref 80.0–100.0)
Platelets: 210 10*3/uL (ref 150–400)
RBC: 4.7 MIL/uL (ref 4.22–5.81)
RDW: 12.4 % (ref 11.5–15.5)
WBC: 6.9 10*3/uL (ref 4.0–10.5)
nRBC: 0 % (ref 0.0–0.2)

## 2019-07-31 LAB — URINALYSIS, ROUTINE W REFLEX MICROSCOPIC
Bilirubin Urine: NEGATIVE
Glucose, UA: NEGATIVE mg/dL
Hgb urine dipstick: NEGATIVE
Ketones, ur: NEGATIVE mg/dL
Leukocytes,Ua: NEGATIVE
Nitrite: NEGATIVE
Protein, ur: NEGATIVE mg/dL
Specific Gravity, Urine: 1.028 (ref 1.005–1.030)
pH: 5 (ref 5.0–8.0)

## 2019-07-31 LAB — COMPREHENSIVE METABOLIC PANEL
ALT: 173 U/L — ABNORMAL HIGH (ref 0–44)
AST: 110 U/L — ABNORMAL HIGH (ref 15–41)
Albumin: 3.8 g/dL (ref 3.5–5.0)
Alkaline Phosphatase: 67 U/L (ref 38–126)
Anion gap: 8 (ref 5–15)
BUN: 15 mg/dL (ref 6–20)
CO2: 22 mmol/L (ref 22–32)
Calcium: 8.8 mg/dL — ABNORMAL LOW (ref 8.9–10.3)
Chloride: 109 mmol/L (ref 98–111)
Creatinine, Ser: 0.99 mg/dL (ref 0.61–1.24)
GFR calc Af Amer: >60 mL/min (ref >60)
GFR calc non Af Amer: >60 mL/min (ref >60)
Glucose, Bld: 133 mg/dL — ABNORMAL HIGH (ref 70–99)
Potassium: 4.5 mmol/L (ref 3.5–5.1)
Sodium: 139 mmol/L (ref 135–145)
Total Bilirubin: 0.6 mg/dL (ref 0.3–1.2)
Total Protein: 6.1 g/dL — ABNORMAL LOW (ref 6.5–8.1)

## 2019-07-31 LAB — LIPASE, BLOOD: Lipase: 82 U/L — ABNORMAL HIGH (ref 11–51)

## 2019-07-31 MED ORDER — KETOROLAC TROMETHAMINE 30 MG/ML IJ SOLN
30.0000 mg | Freq: Once | INTRAMUSCULAR | Status: AC
  Administered 2019-07-31: 30 mg via INTRAVENOUS
  Filled 2019-07-31: qty 1

## 2019-07-31 MED ORDER — IOHEXOL 300 MG/ML  SOLN
100.0000 mL | Freq: Once | INTRAMUSCULAR | Status: AC | PRN
  Administered 2019-07-31: 13:00:00 100 mL via INTRAVENOUS

## 2019-07-31 MED ORDER — SODIUM CHLORIDE 0.9 % IV BOLUS
1000.0000 mL | Freq: Once | INTRAVENOUS | Status: AC
  Administered 2019-07-31: 12:00:00 1000 mL via INTRAVENOUS

## 2019-07-31 MED ORDER — LEVOFLOXACIN 500 MG PO TABS: 500.0000 mg | ORAL_TABLET | Freq: Every day | ORAL | 0 refills | Status: DC

## 2019-07-31 MED ORDER — LEVOFLOXACIN 500 MG PO TABS
500.0000 mg | ORAL_TABLET | Freq: Once | ORAL | Status: AC
  Administered 2019-07-31: 500 mg via ORAL
  Filled 2019-07-31: qty 1

## 2019-07-31 NOTE — ED Triage Notes (Signed)
C/o frequent urination, bilateral flank pain, and diarrhea x 2 weeks.

## 2019-07-31 NOTE — ED Provider Notes (Addendum)
[facility] EMERGENCY DEPARTMENT Provider Note   CSN: [id number] Arrival date & time: 07/31/19  5916     History Chief Complaint  Patient presents with  . Diarrhea  . frequent urination  . Flank Pain    William Caldwell is a 24 y.o. male.  Pt presents to the ED today with abdominal pain, diarrhea, and frequent urination.  Sx have been going on for about 2 weeks.  He denies any f/c.          Past Medical History:  Diagnosis Date  . Anxiety   . Herpes     There are no problems to display for this patient.   History reviewed. No pertinent surgical history.     No family history on file.  Social History   Tobacco Use  . Smoking status: Current Every Day Smoker    Packs/day: 1.00  . Smokeless tobacco: Never Used  Substance Use Topics  . Alcohol use: Not Currently    Comment: occassional  . Drug use: Not Currently    Home Medications Prior to Admission medications   Medication Sig Start Date End Date Taking? Authorizing Provider  hydrOXYzine (ATARAX/VISTARIL) 25 MG tablet Take 25 mg by mouth 3 (three) times daily as needed for anxiety. 07/19/19  Yes [provider]  mirtazapine (REMERON) 30 MG tablet Take 30 mg by mouth at bedtime. 07/19/19  Yes [provider]  valACYclovir (VALTREX) 500 MG tablet Take 1 tablet (500 mg total) by mouth daily. 05/01/19  Yes Arvilla Market, DO  levofloxacin (LEVAQUIN) 500 MG tablet Take 1 tablet (500 mg total) by mouth daily. 07/31/19   Jacalyn Lefevre, MD    Allergies    Patient has no known allergies.  Review of Systems   Review of Systems  Gastrointestinal: Positive for abdominal pain and diarrhea.  Endocrine: Positive for polyuria.  All other systems reviewed and are negative.   Physical Exam Updated Vital Signs BP 122/79   Pulse 89   Temp 98.4 F (36.9 C) (Oral)   Resp 16   SpO2 98%   Physical Exam Vitals and nursing note reviewed. Exam conducted with a chaperone  present.  Constitutional:      Appearance: Normal appearance.  HENT:     Head: Normocephalic and atraumatic.     Right Ear: External ear normal.     Left Ear: External ear normal.     Nose: Nose normal.     Mouth/Throat:     Mouth: Mucous membranes are dry.  Eyes:     Extraocular Movements: Extraocular movements intact.     Conjunctiva/sclera: Conjunctivae normal.     Pupils: Pupils are equal, round, and reactive to light.  Cardiovascular:     Rate and Rhythm: Regular rhythm. Tachycardia present.     Pulses: Normal pulses.     Heart sounds: Normal heart sounds.  Pulmonary:     Effort: Pulmonary effort is normal.     Breath sounds: Normal breath sounds.  Abdominal:     General: Abdomen is flat. Bowel sounds are normal.     Palpations: Abdomen is soft.     Tenderness: There is generalized abdominal tenderness.  Genitourinary:    Penis: Normal and circumcised.      Testes:        Right: Tenderness present.     Comments: No herpetic lesions. Musculoskeletal:        General: Normal range of motion.     Cervical back: Normal range of motion  and neck supple.  Skin:    General: Skin is warm.     Capillary Refill: Capillary refill takes less than 2 seconds.  Neurological:     General: No focal deficit present.     Mental Status: He is alert and oriented to person, place, and time.  Psychiatric:        Mood and Affect: Mood normal.        Behavior: Behavior normal.        Thought Content: Thought content normal.        Judgment: Judgment normal.     ED Results / Procedures / Treatments   Labs (all labs ordered are listed, but only abnormal results are displayed) Labs Reviewed  LIPASE, BLOOD - Abnormal; Notable for the following components:      Result Value   Lipase 82 (*)    All other components within normal limits  COMPREHENSIVE METABOLIC PANEL - Abnormal; Notable for the following components:   Glucose, Bld 133 (*)    Calcium 8.8 (*)    Total Protein 6.1 (*)     AST 110 (*)    ALT 173 (*)    All other components within normal limits  GASTROINTESTINAL PANEL BY PCR, STOOL (REPLACES STOOL CULTURE)  C DIFFICILE QUICK SCREEN W PCR REFLEX  CBC  URINALYSIS, ROUTINE W REFLEX MICROSCOPIC    EKG None  Radiology CT ABDOMEN PELVIS W CONTRAST  Result Date: 07/31/2019 CLINICAL DATA:  Bilateral flank pain and diarrhea for the past 2 weeks. EXAM: CT ABDOMEN AND PELVIS WITH CONTRAST TECHNIQUE: Multidetector CT imaging of the abdomen and pelvis was performed using the standard protocol following bolus administration of intravenous contrast. CONTRAST:  OMNIPAQUE IOHEXOL 300 MG/ML  SOLN COMPARISON:  None. FINDINGS: Lower chest: No acute abnormality. Hepatobiliary: No focal liver abnormality is seen. No gallstones, gallbladder wall thickening, or biliary dilatation. Pancreas: Unremarkable. No pancreatic ductal dilatation or surrounding inflammatory changes. Spleen: Normal in size without focal abnormality. Adrenals/Urinary Tract: Adrenal glands and left kidney are unremarkable. Several subcentimeter low-density lesions in the right kidney are too small to characterize, but likely cysts. No renal calculi or hydronephrosis. The bladder is unremarkable. Stomach/Bowel: Stomach is within normal limits. Appendix appears normal. No evidence of bowel wall thickening, distention, or inflammatory changes. Vascular/Lymphatic: No significant vascular findings are present. No enlarged abdominal or pelvic lymph nodes. Reproductive: Prostate is unremarkable. Other: No abdominal wall hernia or abnormality. No abdominopelvic ascites. No pneumoperitoneum. Musculoskeletal: No acute or significant osseous findings. IMPRESSION: No acute intra-abdominal process. Electronically Signed   By: Obie Dredge M.D.   On: 07/31/2019 13:29   US SCROTUM W/DOPPLER  Result Date: 07/31/2019 CLINICAL DATA:  Two week history of scrotal pain bilaterally EXAM: SCROTAL ULTRASOUND DOPPLER ULTRASOUND OF THE  TESTICLES TECHNIQUE: Complete ultrasound examination of the testicles, epididymis, and other scrotal structures was performed. Color and spectral Doppler ultrasound were also utilized to evaluate blood flow to the testicles. COMPARISON:  None. FINDINGS: Right testicle Measurements: 4.0 x 1.8 x 2.6 cm. No mass or microlithiasis visualized. Right testis is borderline hyperemic. Left testicle Measurements: 3.7 x 1.7 x 2.5 cm. No mass or microlithiasis visualized. Left testis is borderline hyperemic. Right epididymis: Normal in size and appearance. No inflammatory focus evident. Left epididymis: Normal in size and appearance. No inflammatory no inflammatory focus evident. Hydrocele:  None visualized. Varicocele: There is a varicocele noted with Valsalva maneuver on each side. Pulsed Doppler interrogation of both testes demonstrates normal low resistance arterial and venous  waveforms bilaterally. No scrotal wall thickening or scrotal abscess on either side. IMPRESSION: 1. Each testis is borderline hyperemic. There may be a degree of bilateral orchitis. 2.  Varicocele on each side. 3. No intratesticular or extratesticular mass on either side. No appreciable hydrocele on either side. No evident testicular torsion on either side. Electronically Signed   By: Lowella Grip III M.D.   On: 07/31/2019 12:16    Procedures Procedures (including critical care time)  Medications Ordered in ED Medications  levofloxacin (LEVAQUIN) tablet 500 mg (has no administration in time range)  sodium chloride 0.9 % bolus 1,000 mL (1,000 mLs Intravenous New Bag/Given 07/31/19 1137)  ketorolac (TORADOL) 30 MG/ML injection 30 mg (30 mg Intravenous Given 07/31/19 1138)  iohexol (OMNIPAQUE) 300 MG/ML solution 100 mL (100 mLs Intravenous Contrast Given 07/31/19 1307)    ED Course  I have reviewed the triage vital signs and the nursing notes.  Pertinent labs & imaging results that were available during my care of the patient were  reviewed by me and considered in my medical decision making (see chart for details).    MDM Rules/Calculators/A&P                      [place] does show evidence of orchitis.  Pt was here in December for the same.  He was checked for STDs and was neg.  He was checked again in Feb and was neg.  His GF was checked a few days ago and was negative.  I will treat with levaquin as I think gonorrhea suspicion is low.  Pt is told to f/u with urology.  Pt also has some elevated lfts.  He said this has been chronic.  He denies etoh abuse.  He is told to avoid tylenol.  He is told to f/u with gi.  Return if worse.  Pt also had a formed stool while here.  I doubt any significant diarrhea issue.  Final Clinical Impression(s) / ED Diagnoses Final diagnoses:  Orchitis  Elevated LFTs    Rx / DC Orders ED Discharge Orders         Ordered    levofloxacin (LEVAQUIN) 500 MG tablet  Daily     07/31/19 1427           Isla Pence, MD 07/31/19 1432    Isla Pence, MD 07/31/19 1433

## 2019-07-31 NOTE — ED Notes (Signed)
Pt up to [facility] (I) for BM.  Pt had solid large stool without noted blood or black color.  No need to send C diff sample.

## 2019-08-01 ENCOUNTER — Encounter: Payer: Self-pay | Admitting: Gastroenterology

## 2019-09-04 ENCOUNTER — Ambulatory Visit: Payer: Self-pay | Admitting: Gastroenterology

## 2020-01-20 ENCOUNTER — Encounter (HOSPITAL_COMMUNITY): Payer: Self-pay

## 2020-01-20 ENCOUNTER — Other Ambulatory Visit: Payer: Self-pay

## 2020-01-20 ENCOUNTER — Emergency Department (HOSPITAL_COMMUNITY)
Admission: EM | Admit: 2020-01-20 | Discharge: 2020-01-21 | Disposition: A | Discharge status: Home / Self Care | Payer: Self-pay | Attending: Emergency Medicine | Admitting: Emergency Medicine

## 2020-01-20 DIAGNOSIS — R1084 Generalized abdominal pain: Secondary | ICD-10-CM | POA: Insufficient documentation

## 2020-01-20 DIAGNOSIS — Z5321 Procedure and treatment not carried out due to patient leaving prior to being seen by health care provider: Secondary | ICD-10-CM | POA: Insufficient documentation

## 2020-01-20 LAB — COMPREHENSIVE METABOLIC PANEL
ALT: 34 U/L (ref 0–44)
AST: 26 U/L (ref 15–41)
Albumin: 4.3 g/dL (ref 3.5–5.0)
Alkaline Phosphatase: 61 U/L (ref 38–126)
Anion gap: 10 (ref 5–15)
BUN: 8 mg/dL (ref 6–20)
CO2: 26 mmol/L (ref 22–32)
Calcium: 9.5 mg/dL (ref 8.9–10.3)
Chloride: 103 mmol/L (ref 98–111)
Creatinine, Ser: 1.19 mg/dL (ref 0.61–1.24)
GFR, Estimated: >60 mL/min (ref >60)
Glucose, Bld: 103 mg/dL — ABNORMAL HIGH (ref 70–99)
Potassium: 3.7 mmol/L (ref 3.5–5.1)
Sodium: 139 mmol/L (ref 135–145)
Total Bilirubin: 1.2 mg/dL (ref 0.3–1.2)
Total Protein: 7.2 g/dL (ref 6.5–8.1)

## 2020-01-20 LAB — URINALYSIS, ROUTINE W REFLEX MICROSCOPIC
Bacteria, UA: NONE SEEN
Bilirubin Urine: NEGATIVE
Glucose, UA: NEGATIVE mg/dL
Ketones, ur: NEGATIVE mg/dL
Leukocytes,Ua: NEGATIVE
Nitrite: NEGATIVE
Protein, ur: 30 mg/dL — AB
RBC / HPF: >50 RBC/hpf — ABNORMAL HIGH (ref 0–5)
Specific Gravity, Urine: 1.018 (ref 1.005–1.030)
pH: 5 (ref 5.0–8.0)

## 2020-01-20 LAB — CBC
HCT: 44.5 % (ref 39.0–52.0)
Hemoglobin: 15.2 g/dL (ref 13.0–17.0)
MCH: 30.3 pg (ref 26.0–34.0)
MCHC: 34.2 g/dL (ref 30.0–36.0)
MCV: 88.8 fL (ref 80.0–100.0)
Platelets: 254 10*3/uL (ref 150–400)
RBC: 5.01 MIL/uL (ref 4.22–5.81)
RDW: 12.5 % (ref 11.5–15.5)
WBC: 7 10*3/uL (ref 4.0–10.5)
nRBC: 0 % (ref 0.0–0.2)

## 2020-01-20 LAB — LIPASE, BLOOD: Lipase: 37 U/L (ref 11–51)

## 2020-01-20 NOTE — ED Triage Notes (Signed)
Pt reports intermittent generalized abd cramping and pain since march, pt had GI apt but cancelled it because it improved. Pain is worse at night after eating. Pt has tried multiple OTC medications.

## 2020-01-21 ENCOUNTER — Ambulatory Visit: Payer: Self-pay | Admitting: Physician Assistant

## 2020-01-21 ENCOUNTER — Other Ambulatory Visit: Payer: Self-pay

## 2020-01-21 VITALS — BP 133/91 | HR 73 | Temp 98.7°F | Resp 18 | Ht 64.0 in | Wt 139.0 lb

## 2020-01-21 DIAGNOSIS — G8929 Other chronic pain: Secondary | ICD-10-CM

## 2020-01-21 DIAGNOSIS — N3090 Cystitis, unspecified without hematuria: Secondary | ICD-10-CM

## 2020-01-21 DIAGNOSIS — Z87438 Personal history of other diseases of male genital organs: Secondary | ICD-10-CM

## 2020-01-21 DIAGNOSIS — K5900 Constipation, unspecified: Secondary | ICD-10-CM

## 2020-01-21 DIAGNOSIS — R1013 Epigastric pain: Secondary | ICD-10-CM

## 2020-01-21 MED ORDER — SULFAMETHOXAZOLE-TRIMETHOPRIM 800-160 MG PO TABS: 1.0000 | ORAL_TABLET | Freq: Two times a day (BID) | ORAL | 0 refills | Status: AC

## 2020-01-21 NOTE — Patient Instructions (Addendum)
I encourage you to take omeprazole (Prilosec)  40 mg once a day for the next 4 weeks, increase your water and fiber intake.  You will take Bactrim twice a day for 5 days for the blood in your urine.   Please return to the [facility] in 4 weeks for further review    I hope that you feel better soon  Roney Jaffe, PA-C Physician Assistant [facility] [email or web address]   Gastroesophageal Reflux Disease, Adult Gastroesophageal reflux (GER) happens when acid from the stomach flows up into the tube that connects the mouth and the stomach (esophagus). Normally, food travels down the esophagus and stays in the stomach to be digested. However, when a person has GER, food and stomach acid sometimes move back up into the esophagus. If this becomes a more serious problem, the person may be diagnosed with a disease called gastroesophageal reflux disease (GERD). GERD occurs when the reflux:  Happens often.  Causes frequent or severe symptoms.  Causes problems such as damage to the esophagus. When stomach acid comes in contact with the esophagus, the acid may cause soreness (inflammation) in the esophagus. Over time, GERD may create small holes (ulcers) in the lining of the esophagus. What are the causes? This condition is caused by a problem with the muscle between the esophagus and the stomach (lower esophageal sphincter, or LES). Normally, the LES muscle closes after food passes through the esophagus to the stomach. When the LES is weakened or abnormal, it does not close properly, and that allows food and stomach acid to go back up into the esophagus. The LES can be weakened by certain dietary substances, medicines, and medical conditions, including:  Tobacco use.  Pregnancy.  Having a hiatal hernia.  Alcohol use.  Certain foods and beverages, such as coffee, chocolate, onions, and peppermint. What increases the  risk? You are more likely to develop this condition if you:  Have an increased body weight.  Have a connective tissue disorder.  Use NSAID medicines. What are the signs or symptoms? Symptoms of this condition include:  Heartburn.  Difficult or painful swallowing.  The feeling of having a lump in the throat.  Abitter taste in the mouth.  Bad breath.  Having a large amount of saliva.  Having an upset or bloated stomach.  Belching.  Chest pain. Different conditions can cause chest pain. Make sure you see your health care provider if you experience chest pain.  Shortness of breath or wheezing.  Ongoing (chronic) cough or a night-time cough.  Wearing away of tooth enamel.  Weight loss. How is this diagnosed? Your health care provider will take a medical history and perform a physical exam. To determine if you have mild or severe GERD, your health care provider may also monitor how you respond to treatment. You may also have tests, including:  A test to examine your stomach and esophagus with a small camera (endoscopy).  A test thatmeasures the acidity level in your esophagus.  A test thatmeasures how much pressure is on your esophagus.  A barium swallow or modified barium swallow test to show the shape, size, and functioning of your esophagus. How is this treated? The goal of treatment is to help relieve your symptoms and to prevent complications. Treatment for this condition may vary depending on how severe your symptoms are. Your health care provider may recommend:  Changes to your diet.  Medicine.  Surgery. Follow these instructions at home: Eating and drinking  Follow a diet as recommended by your health care provider. This may involve avoiding foods and drinks such as: ? Coffee and tea (with or without caffeine). ? Drinks that containalcohol. ? Energy drinks and sports drinks. ? Carbonated drinks or sodas. ? Chocolate and cocoa. ? Peppermint and  mint flavorings. ? Garlic and onions. ? Horseradish. ? Spicy and acidic foods, including peppers, chili powder, curry powder, vinegar, hot sauces, and barbecue sauce. ? Citrus fruit juices and citrus fruits, such as oranges, lemons, and limes. ? Tomato-based foods, such as red sauce, chili, salsa, and pizza with red sauce. ? Fried and fatty foods, such as donuts, french fries, potato chips, and high-fat dressings. ? High-fat meats, such as hot dogs and fatty cuts of red and white meats, such as rib eye steak, sausage, ham, and bacon. ? High-fat dairy items, such as whole milk, butter, and cream cheese.  Eat small, frequent meals instead of large meals.  Avoid drinking large amounts of liquid with your meals.  Avoid eating meals during the 2-3 hours before bedtime.  Avoid lying down right after you eat.  Do not exercise right after you eat. Lifestyle   Do not use any products that contain nicotine or tobacco, such as cigarettes, e-cigarettes, and chewing tobacco. If you need help quitting, ask your health care provider.  Try to reduce your stress by using methods such as yoga or meditation. If you need help reducing stress, ask your health care provider.  If you are overweight, reduce your weight to an amount that is healthy for you. Ask your health care provider for guidance about a safe weight loss goal. General instructions  Pay attention to any changes in your symptoms.  Take over-the-counter and prescription medicines only as told by your health care provider. Do not take aspirin, ibuprofen, or other NSAIDs unless your health care provider told you to do so.  Wear loose-fitting clothing. Do not wear anything tight around your waist that causes pressure on your abdomen.  Raise (elevate) the head of your bed about 6 inches (15 cm).  Avoid bending over if this makes your symptoms worse.  Keep all follow-up visits as told by your health care provider. This is  important. Contact a health care provider if:  You have: ? New symptoms. ? Unexplained weight loss. ? Difficulty swallowing or it hurts to swallow. ? Wheezing or a persistent cough. ? A hoarse voice.  Your symptoms do not improve with treatment. Get help right away if you:  Have pain in your arms, neck, jaw, teeth, or back.  Feel sweaty, dizzy, or light-headed.  Have chest pain or shortness of breath.  Vomit and your vomit looks like blood or coffee grounds.  Faint.  Have stool that is bloody or black.  Cannot swallow, drink, or eat. Summary  Gastroesophageal reflux happens when acid from the stomach flows up into the esophagus. GERD is a disease in which the reflux happens often, causes frequent or severe symptoms, or causes problems such as damage to the esophagus.  Treatment for this condition may vary depending on how severe your symptoms are. Your health care provider may recommend diet and lifestyle changes, medicine, or surgery.  Contact a health care provider if you have new or worsening symptoms.  Take over-the-counter and prescription medicines only as told by your health care provider. Do not take aspirin, ibuprofen, or other NSAIDs unless your health care provider told you to do so.  Keep all follow-up visits as told  by your health care provider. This is important. This information is not intended to replace advice given to you by your health care provider. Make sure you discuss any questions you have with your health care provider. Document Revised: 09/13/2017 Document Reviewed: 09/13/2017 Elsevier Patient Education  [place].

## 2020-01-21 NOTE — ED Notes (Signed)
NA for roll call  

## 2020-01-21 NOTE — Progress Notes (Signed)
Established Patient Office Visit  Subjective:  Patient ID: William Caldwell, male    DOB: [birth year]  Age: 24 y.o. MRN: [id number]  CC:  Chief Complaint  Patient presents with  . Abdominal Pain    HPI William Caldwell reports that he has been having generalized abdominal pain and cramping since March.  Reports it has become worse in the last few weeks.  Reports that it is worse at night after eating, will wake him up from his sleep.  Describes the pain as burning, like a Volcano, mostly in the umbilical area and epigastric area.  Endorses heartburn and acid reflux.  Has been using Tums, ibuprofen, Gas-X and laxatives without much relief.  Reports that he gets some relief after smoking marijuana.  Reports that he has been having incomplete bowel movements, having to strain to go.  Denies rectal bleeding or pain.  Reports he does not drink any water.  Denies any new stressors, but does endorse history of anxiety, recent newborn  Reports he continues to have on and off testicular swelling, states that he does self examinations and describes his veins feeling like a sac of worms.  Denies any current swelling or pain.  Reports that he has been taking Valtrex as directed on a daily basis, states that he refrains from unprotected intercourse.  Reports that he went to the emergency department yesterday, did not wait to be seen, however did have labs completed.  Increased stress - new born  - doesn't feel stressed    Past Medical History:  Diagnosis Date  . Anxiety   . Herpes     History reviewed. No pertinent surgical history.  History reviewed. No pertinent family history.  Social History   Socioeconomic History  . Marital status: Single    Spouse name: Not on file  . Number of children: Not on file  . Years of education: Not on file  . Highest education level: Not on file  Occupational History  . Not on file  Tobacco Use  . Smoking status: Current Every Day Smoker    Packs/day:  0.25    Types: Cigarettes  . Smokeless tobacco: Never Used  Vaping Use  . Vaping Use: Never used  Substance and Sexual Activity  . Alcohol use: Not Currently    Comment: occassional  . Drug use: Not Currently  . Sexual activity: Yes    Birth control/protection: Condom  Other Topics Concern  . Not on file  Social History Narrative  . Not on file   Social Determinants of Health   Financial Resource Strain:   . Difficulty of Paying Living Expenses: Not on file  Food Insecurity:   . Worried About Programme researcher, broadcasting/film/video in the Last Year: Not on file  . Ran Out of Food in the Last Year: Not on file  Transportation Needs:   . Lack of Transportation (Medical): Not on file  . Lack of Transportation (Non-Medical): Not on file  Physical Activity:   . Days of Exercise per Week: Not on file  . Minutes of Exercise per Session: Not on file  Stress:   . Feeling of Stress : Not on file  Social Connections:   . Frequency of Communication with Friends and Family: Not on file  . Frequency of Social Gatherings with Friends and Family: Not on file  . Attends Religious Services: Not on file  . Active Member of Clubs or Organizations: Not on file  . Attends Banker Meetings: Not on  file  . Marital Status: Not on file  Intimate Partner Violence:   . Fear of Current or Ex-Partner: Not on file  . Emotionally Abused: Not on file  . Physically Abused: Not on file  . Sexually Abused: Not on file    Outpatient Medications Prior to Visit  Medication Sig Dispense Refill  . hydrOXYzine (ATARAX/VISTARIL) 25 MG tablet Take 25 mg by mouth 3 (three) times daily as needed for anxiety.    . mirtazapine (REMERON) 30 MG tablet Take 30 mg by mouth at bedtime.    . valACYclovir (VALTREX) 500 MG tablet Take 1 tablet (500 mg total) by mouth daily. 30 tablet 3  . levofloxacin (LEVAQUIN) 500 MG tablet Take 1 tablet (500 mg total) by mouth daily. 7 tablet 0   No facility-administered medications prior  to visit.    No Known Allergies  ROS Review of Systems  Constitutional: Negative for chills and fever.  HENT: Negative.   Eyes: Negative.   Respiratory: Negative.   Cardiovascular: Negative.   Gastrointestinal: Positive for abdominal pain and constipation. Negative for diarrhea, nausea and vomiting.  Endocrine: Negative.   Genitourinary: Negative for difficulty urinating, discharge, frequency, genital sores, penile pain and penile swelling.  Musculoskeletal: Negative.   Skin: Negative.   Allergic/Immunologic: Negative.   Neurological: Negative.   Hematological: Negative.   Psychiatric/Behavioral: Negative.       Objective:    Physical Exam Vitals and nursing note reviewed.  Constitutional:      General: He is not in acute distress.    Appearance: He is well-developed. He is not ill-appearing.  HENT:     Head: Normocephalic and atraumatic.     Mouth/Throat:     Mouth: Mucous membranes are moist.     Pharynx: Oropharynx is clear.  Eyes:     Extraocular Movements: Extraocular movements intact.     Pupils: Pupils are equal, round, and reactive to light.  Cardiovascular:     Rate and Rhythm: Normal rate and regular rhythm.  Abdominal:     General: Abdomen is flat. Bowel sounds are normal.     Palpations: Abdomen is soft.     Tenderness: There is abdominal tenderness in the epigastric area and suprapubic area.  Skin:    General: Skin is warm and dry.  Neurological:     General: No focal deficit present.     Mental Status: He is alert.  Psychiatric:        Mood and Affect: Mood normal.        Behavior: Behavior normal.     BP (!) 133/91 (BP Location: Left Arm, Patient Position: Sitting, Cuff Size: Normal)   Pulse 73   Temp 98.7 F (37.1 C) (Oral)   Resp 18   Ht 5\' 4"  (1.626 m)   Wt 139 lb (63 kg)   SpO2 98%   BMI 23.86 kg/m  Wt Readings from Last 3 Encounters:  01/21/20 139 lb (63 kg)  05/01/19 131 lb 12.8 oz (59.8 kg)  12/06/18 128 lb (58.1 kg)      Health Maintenance Due  Topic Date Due  . Hepatitis C Screening  Never done  . COVID-19 Vaccine (1) Never done  . INFLUENZA VACCINE  Never done    There are no preventive care reminders to display for this patient.  No results found for: TSH Lab Results  Component Value Date   WBC 7.0 01/20/2020   HGB 15.2 01/20/2020   HCT 44.5 01/20/2020   MCV 88.8 01/20/2020  PLT 254 01/20/2020   Lab Results  Component Value Date   NA 139 01/20/2020   K 3.7 01/20/2020   CO2 26 01/20/2020   GLUCOSE 103 (H) 01/20/2020   BUN 8 01/20/2020   CREATININE 1.19 01/20/2020   BILITOT 1.2 01/20/2020   ALKPHOS 61 01/20/2020   AST 26 01/20/2020   ALT 34 01/20/2020   PROT 7.2 01/20/2020   ALBUMIN 4.3 01/20/2020   CALCIUM 9.5 01/20/2020   ANIONGAP 10 01/20/2020   No results found for: CHOL No results found for: HDL No results found for: LDLCALC No results found for: TRIG No results found for: CHOLHDL No results found for: BSWH6P    Assessment & Plan:   Problem List Items Addressed This Visit    None    Visit Diagnoses    Abdominal pain, chronic, epigastric    -  Primary   Relevant Orders   H Pylori, IGM, IGG, IGA AB   Cystitis       Relevant Medications   sulfamethoxazole-trimethoprim (BACTRIM DS) 800-160 MG tablet   History of orchitis       Constipation, unspecified constipation type         1. Abdominal pain, chronic, epigastric Trial prilosec 40 mg OTC, patient education given on GERD lifestyle modifications, [employer] financial assistance information given, patient to f/iu in mobile unit in 4 weeks. - H Pylori, IGM, IGG, IGA AB  2. Cystitis Patient had UA at ED on 01/20/20 that showed large blood  / protein.  Trial Bactrim, increase water intake  Patient declined STI testing  - sulfamethoxazole-trimethoprim (BACTRIM DS) 800-160 MG tablet; Take 1 tablet by mouth 2 (two) times daily for 5 days.  Dispense: 10 tablet; Refill: 0  3. History of orchitis Patient  education given  4. Constipation Patient education given Increase water and fiber   I have reviewed the patient's medical history (PMH, PSH, Social History, Family History, Medications, and allergies) , and have been updated if relevant. I spent 45 minutes reviewing chart and  face to face time with patient.    Meds ordered this encounter  Medications  . sulfamethoxazole-trimethoprim (BACTRIM DS) 800-160 MG tablet    Sig: Take 1 tablet by mouth 2 (two) times daily for 5 days.    Dispense:  10 tablet    Refill:  0    Order Specific Question:   Supervising Provider    Answer:   Storm Frisk [[id number]]    Follow-up: Return in about 4 weeks (around 02/18/2020).    Kasandra Knudsen Mayers, PA-C

## 2020-01-23 ENCOUNTER — Telehealth: Payer: Self-pay | Admitting: *Deleted

## 2020-01-23 LAB — H PYLORI, IGM, IGG, IGA AB
H pylori, IgM Abs: <9 units (ref 0.0–8.9)
H. pylori, IgA Abs: <9 units (ref 0.0–8.9)
H. pylori, IgG AbS: 0.19 Index Value (ref 0.00–0.79)

## 2020-01-23 NOTE — Telephone Encounter (Signed)
-----   Message from Roney Jaffe, [place] sent at 01/23/2020 10:33 AM EDT ----- Please let patient know that his test for the stomach infection was negative, he should continue the treatment that was discussed

## 2020-01-23 NOTE — Telephone Encounter (Signed)
Patient verified DOB Patient is aware of being negative for stomach ulcer infection. Patient encouraged to use the OTC acid reflux medication, take his anxiety medication, increase water and fiber and to follow up if need be. Patient reports the OTC medication has helped and he is increasing his water and fiber intake.

## 2020-03-21 ENCOUNTER — Encounter (HOSPITAL_COMMUNITY): Payer: Self-pay | Admitting: *Deleted

## 2020-03-21 ENCOUNTER — Emergency Department (HOSPITAL_COMMUNITY): Payer: Self-pay

## 2020-03-21 ENCOUNTER — Emergency Department (HOSPITAL_COMMUNITY)
Admission: EM | Admit: 2020-03-21 | Discharge: 2020-03-21 | Disposition: A | Discharge status: Home / Self Care | Payer: Self-pay | Attending: Emergency Medicine | Admitting: Emergency Medicine

## 2020-03-21 DIAGNOSIS — M791 Myalgia, unspecified site: Secondary | ICD-10-CM | POA: Insufficient documentation

## 2020-03-21 DIAGNOSIS — Z20822 Contact with and (suspected) exposure to covid-19: Secondary | ICD-10-CM | POA: Insufficient documentation

## 2020-03-21 DIAGNOSIS — F17210 Nicotine dependence, cigarettes, uncomplicated: Secondary | ICD-10-CM | POA: Insufficient documentation

## 2020-03-21 DIAGNOSIS — J069 Acute upper respiratory infection, unspecified: Secondary | ICD-10-CM | POA: Insufficient documentation

## 2020-03-21 LAB — POC SARS CORONAVIRUS 2 AG -  ED: SARS Coronavirus 2 Ag: NEGATIVE

## 2020-03-21 MED ORDER — ALBUTEROL SULFATE HFA 108 (90 BASE) MCG/ACT IN AERS
2.0000 | INHALATION_SPRAY | Freq: Once | RESPIRATORY_TRACT | Status: AC
  Administered 2020-03-21: 2 via RESPIRATORY_TRACT
  Filled 2020-03-21: qty 6.7

## 2020-03-21 MED ORDER — PREDNISONE 50 MG PO TABS: 50.0000 mg | ORAL_TABLET | Freq: Every day | ORAL | 0 refills | Status: AC

## 2020-03-21 MED ORDER — ALBUTEROL SULFATE HFA 108 (90 BASE) MCG/ACT IN AERS: 1.0000 | INHALATION_SPRAY | Freq: Four times a day (QID) | RESPIRATORY_TRACT | 0 refills | Status: DC | PRN

## 2020-03-21 MED ORDER — PREDNISONE 20 MG PO TABS
60.0000 mg | ORAL_TABLET | Freq: Once | ORAL | Status: AC
  Administered 2020-03-21: 60 mg via ORAL
  Filled 2020-03-21: qty 3

## 2020-03-21 NOTE — Discharge Instructions (Signed)
Your chest x-ray did not show any evidence of any acute abnormality  Your rapid Covid test was negative.  As we discussed in the room there is a high false-negative rate for this.  Therefore we have gotten a PCR test.  The results will be on the con MyChart account.  There is information at the end of your discharge instructions on how to set up this account.  Until this test results I would suggest quarantining at home as if you are Covid positive.  I have written you for some steroids and albuterol inhaler to help with your shortness of breath.  I would suggest returning for any worsening symptoms.  If your Covid test is positive I have written you out of work per [employer] guidelines.

## 2020-03-21 NOTE — ED Provider Notes (Signed)
[facility] DEPT Provider Note   CSN: [id number] Arrival date & time: 03/21/20  1229    History Chief Complaint  Patient presents with  . Cough  . Shortness of Breath    William Caldwell is a 25 y.o. male with past medical history significant for anxiety who presents for evaluation of upper respiratory complaints.  Patient states over the past few months he has had intermittent episodes of shortness of breath.  Patient states over the last 3 days he has had nonproductive cough.  States he intermittently feels short of breath when he coughs.  He has had headache, myalgias, congestion and rhinorrhea.  He is vaccine against COVID.  No known exposures.  He denies fever, chills, nausea, vomiting, chest pain, hemoptysis, abdominal pain, diarrhea, dysuria, unilateral leg swelling, redness or warmth.  Denies additional aggravating or alleviating factors.  History obtained from patient and past medical records.  No interpreter used.  HPI     Past Medical History:  Diagnosis Date  . Anxiety   . Herpes     There are no problems to display for this patient.   History reviewed. No pertinent surgical history.     No family history on file.  Social History   Tobacco Use  . Smoking status: Current Every Day Smoker    Packs/day: 0.25    Types: Cigarettes  . Smokeless tobacco: Never Used  Vaping Use  . Vaping Use: Never used  Substance Use Topics  . Alcohol use: Not Currently    Comment: occassional  . Drug use: Not Currently    Home Medications Prior to Admission medications   Medication Sig Start Date End Date Taking? Authorizing Provider  albuterol (VENTOLIN HFA) 108 (90 Base) MCG/ACT inhaler Inhale 1-2 puffs into the lungs every 6 (six) hours as needed for wheezing or shortness of breath. 03/21/20  Yes [facility] A, PA-C  predniSONE (DELTASONE) 50 MG tablet Take 1 tablet (50 mg total) by mouth daily for 4 days. 03/21/20 03/25/20 Yes Keats Kingry  A, PA-C  hydrOXYzine (ATARAX/VISTARIL) 25 MG tablet Take 25 mg by mouth 3 (three) times daily as needed for anxiety. 07/19/19   [provider]  mirtazapine (REMERON) 30 MG tablet Take 30 mg by mouth at bedtime. 07/19/19   [provider]  valACYclovir (VALTREX) 500 MG tablet Take 1 tablet (500 mg total) by mouth daily. 05/01/19   Nicolette Bang, DO    Allergies    Patient has no known allergies.  Review of Systems   Review of Systems  Constitutional: Negative.   HENT: Positive for congestion, rhinorrhea and sneezing. Negative for sore throat, tinnitus, trouble swallowing and voice change.   Eyes: Negative.   Respiratory: Positive for cough and shortness of breath (Intermittent). Negative for wheezing and stridor.   Cardiovascular: Negative.   Gastrointestinal: Negative.   Genitourinary: Negative.   Musculoskeletal: Positive for myalgias. Negative for arthralgias, back pain, gait problem, joint swelling, neck pain and neck stiffness.  Skin: Negative.   Neurological: Negative.   All other systems reviewed and are negative.   Physical Exam Updated Vital Signs BP (!) 131/98 (BP Location: Right Arm)   Pulse 80   Temp 98.2 F (36.8 C) (Oral)   Resp 15   SpO2 99%   Physical Exam Vitals and nursing note reviewed.  Constitutional:      General: He is not in acute distress.    Appearance: He is not ill-appearing, toxic-appearing or diaphoretic.  HENT:  Head: Normocephalic and atraumatic.     Jaw: There is normal jaw occlusion.     Right Ear: Tympanic membrane, ear canal and external ear normal. There is no impacted cerumen. No hemotympanum. Tympanic membrane is not injected, scarred, perforated, erythematous, retracted or bulging.     Left Ear: Tympanic membrane, ear canal and external ear normal. There is no impacted cerumen. No hemotympanum. Tympanic membrane is not injected, scarred, perforated, erythematous, retracted or bulging.     Ears:      Comments: No Mastoid tenderness.    Nose:     Comments: Clear rhinorrhea and congestion to bilateral nares.  No sinus tenderness.    Mouth/Throat:     Comments: Posterior oropharynx clear.  Mucous membranes moist.  Tonsils without erythema or exudate.  Uvula midline without deviation.  No evidence of PTA or RPA.  No drooling, dysphasia or trismus.  Phonation normal. Neck:     Trachea: Trachea and phonation normal.     Comments: No Neck stiffness or neck rigidity.  No meningismus.  No cervical lymphadenopathy. Cardiovascular:     Rate and Rhythm: Normal rate.     Pulses: Normal pulses.          Radial pulses are 2+ on the right side and 2+ on the left side.     Heart sounds: Normal heart sounds.     Comments: No murmurs rubs or gallops. Pulmonary:     Effort: Pulmonary effort is normal.     Breath sounds: Wheezing present.     Comments: Expiratory wheeze. No accessory muscle usage.  Able speak in full sentences. Abdominal:     Comments: Soft, nontender without rebound or guarding.  No CVA tenderness.  Musculoskeletal:     Cervical back: Full passive range of motion without pain and normal range of motion.     Comments: Moves all 4 extremities without difficulty.  Lower extremities without edema, erythema or warmth.  Skin:    Comments: Brisk capillary refill.  No rashes or lesions.  Neurological:     Mental Status: He is alert.     Comments: Ambulatory in department without difficulty.  Cranial nerves II through XII grossly intact.  No facial droop.  No aphasia.     ED Results / Procedures / Treatments   Labs (all labs ordered are listed, but only abnormal results are displayed) Labs Reviewed  SARS CORONAVIRUS 2 (TAT 6-24 HRS)  POC SARS CORONAVIRUS 2 AG -  ED    EKG None  Radiology DG Chest Portable 1 View  Result Date: 03/21/2020 CLINICAL DATA:  Cough, shortness of breath. EXAM: PORTABLE CHEST 1 VIEW COMPARISON:  None. FINDINGS: The heart size and mediastinal contours are  within normal limits. Both lungs are clear. No pneumothorax or pleural effusion is noted. The visualized skeletal structures are unremarkable. IMPRESSION: No active disease. Electronically Signed   By: Lupita Raider M.D.   On: 03/21/2020 17:05    Procedures Procedures (including critical care time)  Medications Ordered in ED Medications  predniSONE (DELTASONE) tablet 60 mg (60 mg Oral Given 03/21/20 1658)  albuterol (VENTOLIN HFA) 108 (90 Base) MCG/ACT inhaler 2 puff (2 puffs Inhalation Given 03/21/20 1658)    ED Course  I have reviewed the triage vital signs and the nursing notes.  Pertinent labs & imaging results that were available during my care of the patient were reviewed by me and considered in my medical decision making (see chart for details).  Patient presents for evaluation of  cough, shortness of breath, headache, myalgias.  He is vaccinations COVID.  No known Covid exposures.  On arrival he is afebrile, nonseptic, non-ill-appearing.  He has no tachycardia, tachypnea or hypoxia.  He is able speak in full sentences, does not any respiratory distress.  Does have some diffuse expiratory wheeze.  No history of asthma.  His abdomen is soft, nontender.  He is nonfocal neuro exam without deficit.  We will plan on chest x-ray treat for wheeze, Covid test and reassess.  Labs and imaging personally reviewed and interpreted: Rapid COIVD negtive COVID PCR pending DG chest without acute infiltrates, cardiomegaly, pulmonary edema, pneumothorax  Patient given albuterol and steroids here in ED.  Likely viral upper respiratory infection, his rapid test for Covid was negative however will obtain Covid PCR.  He will follow up with these results on MyChart.  At this time I have low suspicion for ACS, PE, dissection, infectious process, pneumothorax as cause of his symptoms.  He will return for any worsening symptoms.  The patient has been appropriately medically screened and/or stabilized in the ED. I  have low suspicion for any other emergent medical condition which would require further screening, evaluation or treatment in the ED or require inpatient management.  Patient is hemodynamically stable and in no acute distress.  Patient able to ambulate in department prior to ED.  Evaluation does not show acute pathology that would require ongoing or additional emergent interventions while in the emergency department or further inpatient treatment.  I have discussed the diagnosis with the patient and answered all questions.  Pain is been managed while in the emergency department and patient has no further complaints prior to discharge.  Patient is comfortable with plan discussed in room and is stable for discharge at this time.  I have discussed strict return precautions for returning to the emergency department.  Patient was encouraged to follow-up with PCP/specialist refer to at discharge.    MDM Rules/Calculators/A&P                         Brentin Shin was evaluated in Emergency Department on 03/21/2020 for the symptoms described in the history of present illness. He was evaluated in the context of the global COVID-19 pandemic, which necessitated consideration that the patient might be at risk for infection with the SARS-CoV-2 virus that causes COVID-19. Institutional protocols and algorithms that pertain to the evaluation of patients at risk for COVID-19 are in a state of rapid change based on information released by regulatory bodies including the CDC and federal and state organizations. These policies and algorithms were followed during the patient's care in the ED. Final Clinical Impression(s) / ED Diagnoses Final diagnoses:  Viral URI  Person under investigation for COVID-19    Rx / DC Orders ED Discharge Orders         Ordered    predniSONE (DELTASONE) 50 MG tablet  Daily        03/21/20 1732    albuterol (VENTOLIN HFA) 108 (90 Base) MCG/ACT inhaler  Every 6 hours PRN        03/21/20 1732            Leightyn Cina A, PA-C 03/21/20 1734    Milagros Loll, MD 03/23/20 2328

## 2020-03-21 NOTE — ED Notes (Signed)
Patient refuses 2nd covid test.

## 2020-03-21 NOTE — ED Triage Notes (Addendum)
Pt complains of headache, cough, shortness of breath. Shortness of breath for a couple of months, cough for 3-4 days. Also has pain under bilateral armpits

## 2020-03-21 NOTE — ED Notes (Signed)
Informed Provider that POC Covid is neg.

## 2020-05-06 ENCOUNTER — Ambulatory Visit (INDEPENDENT_AMBULATORY_CARE_PROVIDER_SITE_OTHER): Payer: No Payment, Other | Admitting: Professional

## 2020-05-06 ENCOUNTER — Other Ambulatory Visit: Payer: Self-pay

## 2020-05-06 DIAGNOSIS — F411 Generalized anxiety disorder: Secondary | ICD-10-CM

## 2020-05-06 DIAGNOSIS — F321 Major depressive disorder, single episode, moderate: Secondary | ICD-10-CM | POA: Diagnosis not present

## 2020-05-06 DIAGNOSIS — F4310 Post-traumatic stress disorder, unspecified: Secondary | ICD-10-CM | POA: Diagnosis not present
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
## 2020-05-28 ENCOUNTER — Other Ambulatory Visit: Payer: Self-pay

## 2020-05-28 ENCOUNTER — Telehealth (HOSPITAL_COMMUNITY): Payer: Self-pay | Admitting: Professional

## 2020-05-28 ENCOUNTER — Ambulatory Visit (INDEPENDENT_AMBULATORY_CARE_PROVIDER_SITE_OTHER): Payer: No Payment, Other | Admitting: Professional

## 2020-05-28 DIAGNOSIS — F411 Generalized anxiety disorder: Secondary | ICD-10-CM | POA: Diagnosis not present

## 2020-05-28 DIAGNOSIS — F4310 Post-traumatic stress disorder, unspecified: Secondary | ICD-10-CM

## 2020-05-28 DIAGNOSIS — F321 Major depressive disorder, single episode, moderate: Secondary | ICD-10-CM
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
## 2020-05-28 NOTE — Telephone Encounter (Signed)
See call log 

## 2020-06-04 ENCOUNTER — Ambulatory Visit (HOSPITAL_COMMUNITY): Payer: No Payment, Other | Admitting: Psychiatry

## 2020-06-11 ENCOUNTER — Ambulatory Visit (HOSPITAL_COMMUNITY): Payer: No Payment, Other | Admitting: Professional

## 2020-06-11 ENCOUNTER — Other Ambulatory Visit: Payer: Self-pay

## 2020-06-11 ENCOUNTER — Telehealth (HOSPITAL_COMMUNITY): Payer: Self-pay | Admitting: Professional

## 2020-06-11 NOTE — Telephone Encounter (Signed)
See call log 

## 2020-06-25 ENCOUNTER — Ambulatory Visit (INDEPENDENT_AMBULATORY_CARE_PROVIDER_SITE_OTHER): Payer: No Payment, Other | Admitting: Professional

## 2020-06-25 ENCOUNTER — Other Ambulatory Visit: Payer: Self-pay

## 2020-06-25 ENCOUNTER — Telehealth (HOSPITAL_COMMUNITY): Payer: Self-pay | Admitting: Professional

## 2020-06-25 DIAGNOSIS — F321 Major depressive disorder, single episode, moderate: Secondary | ICD-10-CM | POA: Diagnosis not present

## 2020-06-25 DIAGNOSIS — F411 Generalized anxiety disorder: Secondary | ICD-10-CM

## 2020-06-25 NOTE — Telephone Encounter (Signed)
See call log 
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
## 2020-07-16 ENCOUNTER — Telehealth (HOSPITAL_COMMUNITY): Payer: Self-pay | Admitting: Professional

## 2020-07-16 ENCOUNTER — Ambulatory Visit (HOSPITAL_COMMUNITY): Payer: No Payment, Other | Admitting: Professional

## 2020-07-16 ENCOUNTER — Other Ambulatory Visit: Payer: Self-pay

## 2020-07-16 NOTE — Telephone Encounter (Signed)
See call log 

## 2020-07-29 ENCOUNTER — Other Ambulatory Visit: Payer: Self-pay

## 2020-07-29 ENCOUNTER — Ambulatory Visit (INDEPENDENT_AMBULATORY_CARE_PROVIDER_SITE_OTHER): Payer: No Payment, Other | Admitting: Physician Assistant

## 2020-07-29 ENCOUNTER — Encounter (HOSPITAL_COMMUNITY): Payer: Self-pay | Admitting: Physician Assistant

## 2020-07-29 DIAGNOSIS — G479 Sleep disorder, unspecified: Secondary | ICD-10-CM | POA: Insufficient documentation

## 2020-07-29 DIAGNOSIS — F332 Major depressive disorder, recurrent severe without psychotic features: Secondary | ICD-10-CM

## 2020-07-29 DIAGNOSIS — F411 Generalized anxiety disorder: Secondary | ICD-10-CM | POA: Diagnosis not present

## 2020-07-29 DIAGNOSIS — F4310 Post-traumatic stress disorder, unspecified: Secondary | ICD-10-CM

## 2020-07-29 MED ORDER — SERTRALINE HCL 50 MG PO TABS: 50.0000 mg | ORAL_TABLET | Freq: Every day | ORAL | 1 refills | Status: DC
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
## 2020-08-23 ENCOUNTER — Telehealth: Payer: Self-pay | Admitting: Family

## 2020-08-23 DIAGNOSIS — J029 Acute pharyngitis, unspecified: Secondary | ICD-10-CM

## 2020-08-23 MED ORDER — AMOXICILLIN 500 MG PO CAPS: 500.0000 mg | ORAL_CAPSULE | Freq: Two times a day (BID) | ORAL | 0 refills | Status: AC

## 2020-08-23 NOTE — Progress Notes (Signed)

## 2020-09-09 ENCOUNTER — Other Ambulatory Visit: Payer: Self-pay

## 2020-09-09 ENCOUNTER — Telehealth (INDEPENDENT_AMBULATORY_CARE_PROVIDER_SITE_OTHER): Payer: No Payment, Other | Admitting: Physician Assistant

## 2020-09-09 ENCOUNTER — Encounter (HOSPITAL_COMMUNITY): Payer: Self-pay | Admitting: Physician Assistant

## 2020-09-09 DIAGNOSIS — F411 Generalized anxiety disorder: Secondary | ICD-10-CM

## 2020-09-09 DIAGNOSIS — F4310 Post-traumatic stress disorder, unspecified: Secondary | ICD-10-CM | POA: Diagnosis not present

## 2020-09-09 DIAGNOSIS — F332 Major depressive disorder, recurrent severe without psychotic features: Secondary | ICD-10-CM

## 2020-09-09 MED ORDER — BUSPIRONE HCL 7.5 MG PO TABS: 7.5000 mg | ORAL_TABLET | Freq: Two times a day (BID) | ORAL | 1 refills | Status: AC

## 2020-09-09 MED ORDER — SERTRALINE HCL 50 MG PO TABS: 50.0000 mg | ORAL_TABLET | Freq: Every day | ORAL | 1 refills | Status: DC

## 2020-09-09 NOTE — Progress Notes (Signed)
BH MD/PA/NP OP Progress Note  Virtual Visit via Telephone Note  I connected with William Caldwell on 09/09/20 at  1:30 PM EDT by telephone and verified that I am speaking with the correct person using two identifiers.  Location: Patient: Home Provider: Clinic   I discussed the limitations, risks, security and privacy concerns of performing an evaluation and management service by telephone and the availability of in person appointments. I also discussed with the patient that there may be a patient responsible charge related to this service. The patient expressed understanding and agreed to proceed.  Follow Up Instructions:   I discussed the assessment and treatment plan with the patient. The patient was provided an opportunity to ask questions and all were answered. The patient agreed with the plan and demonstrated an understanding of the instructions.   The patient was advised to call back or seek an in-person evaluation if the symptoms worsen or if the condition fails to improve as anticipated.  I provided 20 minutes of non-face-to-face time during this encounter.  William Hatchet, PA   09/09/2020 5:02 PM William Caldwell  MRN:  [id number]  Chief Complaint: Follow up and medication management  HPI:   William Caldwell is a 25 year old male with a past psychiatric history significant for major depressive disorder, generalized anxiety disorder, and PTSD who presents to [facility] via virtual telephone visit for follow-up and medication management.  Patient was placed on the following medication: Sertraline 50 mg daily.  Patient reports that he has not started taking sertraline due to the medication being unavailable when he went to go pick it up.  Patient still expresses experiencing PTSD, depression, and anxiety.  Patient endorses the following depressive symptoms: low mood, decreased energy, feelings of worthlessness/guilt, irritability, sleep  disturbances, and poor appetite.  He rates his depression a 5 - 6 out of 10.  He reports that his anxiety is always at a 10 and attributes the following stressors to his anxiety: issues with his baby mama and past trauma.  He endorses multiple panic attacks characterized by elevated heart rate, mind going blank, weakness of the lower extremities, and the feeling of needing to run off and hide.  A PHQ-9 screen was performed with the patient scoring a 23.  A GAD-7 screen was also performed with the patient scoring a 21.  Patient states that he has had anxiety his whole life.  He compares his anxiety to the example of walking into a new classroom full of kids on the first day of kindergarten.  Patient is calm, cooperative, and fully engaged in conversation during the encounter.  Patient states that he has had a rough morning and feels empty.  Patient denies suicidal or homicidal ideations.  He further denies auditory or visual hallucinations and does not appear to be responding to internal/external stimuli.  Patient endorses fair sleep and receives on average 5 to 6 hours of sleep each night.  Patient endorses decreased appetite and states that he has at least 1 meal a day but often has to force himself to eat it.  Patient denies alcohol consumption.  Patient endorses tobacco use and smokes on average 5 cigarettes/day.  Patient endorses illicit drug use in the form of marijuana which he smokes every day.  Visit Diagnosis:    ICD-10-CM   1. Severe episode of recurrent major depressive disorder, without psychotic features (HCC)  F33.2 sertraline (ZOLOFT) 50 MG tablet    2. Generalized anxiety disorder  F41.1  sertraline (ZOLOFT) 50 MG tablet    busPIRone (BUSPAR) 7.5 MG tablet    3. Posttraumatic stress disorder  F43.10 sertraline (ZOLOFT) 50 MG tablet      Past Psychiatric History:  PTSD Generalized anxiety disorder Major depressive disorder  Past Medical History:  Past Medical History:  Diagnosis  Date   Anxiety    Herpes    History reviewed. No pertinent surgical history.  Family Psychiatric History:  Patient is unsure if there is a family history of psychiatric illness, however, he sees some of the same patterns that he is dealing with in his siblings.  Family History: History reviewed. No pertinent family history.  Social History:  Social History   Socioeconomic History   Marital status: Single    Spouse name: Not on file   Number of children: Not on file   Years of education: Not on file   Highest education level: Not on file  Occupational History   Not on file  Tobacco Use   Smoking status: Every Day    Packs/day: 0.25    Pack years: 0.00    Types: Cigarettes   Smokeless tobacco: Never  Vaping Use   Vaping Use: Never used  Substance and Sexual Activity   Alcohol use: Not Currently    Comment: occassional   Drug use: Not Currently   Sexual activity: Yes    Birth control/protection: Condom  Other Topics Concern   Not on file  Social History Narrative   Not on file   Social Determinants of Health   Financial Resource Strain: Not on file  Food Insecurity: Not on file  Transportation Needs: Not on file  Physical Activity: Not on file  Stress: Not on file  Social Connections: Not on file    Allergies: No Known Allergies  Metabolic Disorder Labs: No results found for: HGBA1C, MPG No results found for: PROLACTIN No results found for: CHOL, TRIG, HDL, CHOLHDL, VLDL, LDLCALC No results found for: TSH  Therapeutic Level Labs: No results found for: LITHIUM No results found for: VALPROATE No components found for:  CBMZ  Current Medications: Current Outpatient Medications  Medication Sig Dispense Refill   busPIRone (BUSPAR) 7.5 MG tablet Take 1 tablet (7.5 mg total) by mouth 2 (two) times daily. 60 tablet 1   albuterol (VENTOLIN HFA) 108 (90 Base) MCG/ACT inhaler Inhale 1-2 puffs into the lungs every 6 (six) hours as needed for wheezing or shortness of  breath. 8 g 0   hydrOXYzine (ATARAX/VISTARIL) 25 MG tablet Take 25 mg by mouth 3 (three) times daily as needed for anxiety.     sertraline (ZOLOFT) 50 MG tablet Take 1 tablet (50 mg total) by mouth daily. Patient to take half tablet (25 mg total) daily for the first week. If patient is tolerating medication, patient to take full tablet (50 mg total) daily at the start of the second week. 30 tablet 1   valACYclovir (VALTREX) 500 MG tablet Take 1 tablet (500 mg total) by mouth daily. 30 tablet 3   No current facility-administered medications for this visit.     Musculoskeletal: Strength & Muscle Tone: Unable to assess due to telemedicine visit Gait & Station: Unable to assess due to telemedicine visit Patient leans: Unable to assess due to telemedicine visit  Psychiatric Specialty Exam: Review of Systems  Psychiatric/Behavioral:  Positive for agitation, decreased concentration and sleep disturbance. Negative for dysphoric mood, hallucinations, self-injury and suicidal ideas. The patient is nervous/anxious. The patient is not hyperactive.  There were no vitals taken for this visit.There is no height or weight on file to calculate BMI.  General Appearance: Unable to assess due to telemedicine visit  Eye Contact:  Unable to assess due to telemedicine visit  Speech:  Clear and Coherent and Normal Rate  Volume:  Normal  Mood:  Anxious, Depressed, Irritable, and Worthless  Affect:  Congruent and Depressed  Thought Process:  Coherent, Goal Directed, and Descriptions of Associations: Intact  Orientation:  Full (Time, Place, and Person)  Thought Content: WDL   Suicidal Thoughts:  No  Homicidal Thoughts:  No  Memory:  Immediate;   Good Recent;   Good Remote;   Good  Judgement:  Fair  Insight:  Fair  Psychomotor Activity:  Restlessness  Concentration:  Concentration: Good and Attention Span: Good  Recall:  Good  Fund of Knowledge: Good  Language: Good  Akathisia:  NA  Handed:  Left   AIMS (if indicated): not done  Assets:  Communication Skills Desire for Improvement Housing Social Support Vocational/Educational  ADL's:  Intact  Cognition: WNL  Sleep:  Fair   Screenings: GAD-7    Flowsheet Row Video Visit from 09/09/2020 in [facility] Office Visit from 07/29/2020 in [facility] Office Visit from 01/21/2020 in [facility] Office Visit from 02/28/2018 in Primary Care at [facility]  Total GAD-7 Score 21 21 17 6       PHQ2-9    Flowsheet Row Video Visit from 09/09/2020 in [facility] Office Visit from 07/29/2020 in [facility] Counselor from 05/06/2020 in [facility] Office Visit from 01/21/2020 in [facility] Office Visit from 05/01/2019 in Primary Care at [facility]  PHQ-2 Total Score 5 6 6 3  0  PHQ-9 Total Score 23 24 21 12  --      Flowsheet Row Video Visit from 09/09/2020 in [facility] Office Visit from 07/29/2020 in [facility] Counselor from 05/06/2020 in [facility]  C-SSRS RISK CATEGORY Low Risk Low Risk No Risk        Assessment and Plan:   William Caldwell is a 25 year old male with a past psychiatric history significant for major depressive disorder, generalized anxiety disorder, and PTSD who presents to [facility] via virtual telephone visit for follow-up and medication management.  Patient reports that he has not started taking sertraline due to the medication being unavailable at his pharmacy when he went to go pick it up.  Patient is endorses depressive symptoms, anxiety, and PTSD related to his past childhood trauma.  Patient was placed back on sertraline 50 mg daily.  Patient was encouraged to take 25 mg (half tablet) for the first week before taking 50 mg at the  start of the second week.  Patient was agreeable to plan.  Patient was also recommended buspirone 7.5 mg 2 times daily the management of his anxiety.  Patient's medications to be e-prescribed to pharmacy of choice.  1. Severe episode of recurrent major depressive disorder, without psychotic features (HCC)  - sertraline (ZOLOFT) 50 MG tablet; Take 1 tablet (50 mg total) by mouth daily. Patient to take half tablet (25 mg total) daily for the first week. If patient is tolerating medication, patient to take full tablet (50 mg total) daily at the start of the second week.  Dispense: 30 tablet; Refill: 1  2. Generalized  anxiety disorder  - sertraline (ZOLOFT) 50 MG tablet; Take 1 tablet (50 mg total) by mouth daily. Patient to take half tablet (25 mg total) daily for the first week. If patient is tolerating medication, patient to take full tablet (50 mg total) daily at the start of the second week.  Dispense: 30 tablet; Refill: 1 - busPIRone (BUSPAR) 7.5 MG tablet; Take 1 tablet (7.5 mg total) by mouth 2 (two) times daily.  Dispense: 60 tablet; Refill: 1  3. Posttraumatic stress disorder  - sertraline (ZOLOFT) 50 MG tablet; Take 1 tablet (50 mg total) by mouth daily. Patient to take half tablet (25 mg total) daily for the first week. If patient is tolerating medication, patient to take full tablet (50 mg total) daily at the start of the second week.  Dispense: 30 tablet; Refill: 1  Patient to follow up in 6 weeks Provider spent a total of 20 minutes with the patient/reviewing patient's chart  William Hatchet, PA 09/09/2020, 5:02 PM

## 2020-09-17 ENCOUNTER — Telehealth: Payer: Self-pay | Admitting: Orthopedic Surgery

## 2020-09-17 ENCOUNTER — Encounter: Payer: Self-pay | Admitting: Physician Assistant

## 2020-09-17 DIAGNOSIS — A6000 Herpesviral infection of urogenital system, unspecified: Secondary | ICD-10-CM

## 2020-09-17 MED ORDER — VALACYCLOVIR HCL 500 MG PO TABS: 500.0000 mg | ORAL_TABLET | Freq: Two times a day (BID) | ORAL | 0 refills | Status: DC

## 2020-09-17 NOTE — Progress Notes (Signed)
E-Visit for Herpes Simplex  We are sorry that you are not feeling well.  Here is how we plan to help!  Based on what you have shared ith me, it looks like you may be having an outbreak/flare-up of genital herpes.    I have prescribed I have prescribed Valacyclovir 500 mg Take one by mouth twice a day for 3 days.    If you have been prescribed long term medications to be taken on a regular basis, it is important to follow the recommendations and take them as ordered.    Outbreaks usually include blisters and open sores in the genital area. Outbreaks that happen after the first time are usually not as severe and do not last as long. Genital Herpes Simplex is a commonly sexually transmitted viral infection that is found worldwide. Most of these genital infections are caused by one or two herpes simplex viruses that is passed from person to person during vaginal, oral, or anal sex. Sometimes, people do not know they have herpes because they do not have any symptoms.  Please be aware that if you have genital herpes you can be contagious even when you are not having rash or flare-up and you may not have any symptoms, even when you are taking suppressive medicines.  Herpes cannot be cured. The disease usually causes most problems during the first few years. After that, the virus is still there, but it causes few to no symptoms. Even when the virus is active, people with herpes can take medicines to reduce and help prevent symptoms.  Herpes is an infection that can cause blisters and open sores on the genital area. Herpes is caused by a virus that is passed from person to person during vaginal, oral, or anal sex. Sometimes, people do not know they have herpes because they do not have any symptoms. Herpes cannot be cured. The disease usually causes most problems during the first few years. After that, the virus is still there, but it causes few to no symptoms. Even when the virus is active, people with herpes  can take medicines to reduce and help prevent symptoms.  If you have been prescribed medications to be taken on a regular basis, it is important to follow the recommendations and take them as ordered.  Some people with herpes never have any symptoms. But other people can develop symptoms within a few weeks of being infected with the herpes virus   Symptoms usually include blisters in the genital area. In women, this area includes the vagina, buttocks, anus, or thighs. In men, this area includes the penis, scrotum, anus, butt, or thighs. The blisters can become painful open sores, which then crust over as they heal. Sometimes, people can have other symptoms that include:  ?Blisters on the mouth or lips ?Fever, headache, or pain in the joints ?Trouble urinating  Outbreaks might occur every month or more often, or just once or twice a year. Sometimes, people can tell when an outbreak will occur, because they feel itching or pain beforehand. Sometimes they do not know that an outbreak is coming because they have no symptoms. Whatever your pattern is, keep in mind that herpes outbreaks usually become less frequent over time as you get older. Certain things, called "triggers," can make outbreaks more likely to occur. These include stress, sunlight, menstrual periods,or getting sick.  Antiviral therapy can shorten the duration of symptoms and signs in primary infection, which, when untreated, can be associated with significant increase in the  symptoms of the disease.  HOME CARE Use a portable bath (such as a "Sitz bath") where you can sit in warm water for about 20 minutes. Your bathtub could also work. Avoid bubble baths.  Keep the genital area clean and dry and avoid tight clothes.  Take over-the-counter pain medicine such as acetaminophen (brand name: Tylenol) or ibuprofen sample brand names: Advil, Motrin). But avoid aspirin.  Only take medications as instructed by your medical team.  You are  most likely to spread herpes to a sex partner when you have blisters and open sores on your body. But it's also possible to spread herpes to your partner when you do not have any symptoms. That is because herpes can be present on your body without causing any symptoms, like blisters or pain.  Telling your sex partner that you have herpes can be hard. But it can help protect them, since there are ways to lower the risk of spreading the infection.   Using a condom every time you have sex  Not having sex when you have symptoms  Not having oral sex if you have blisters or open sores (in the genital area or around your mouth)  MAKE SURE YOU   Understand these instructions. Do not have sex without using a condom until you have been seen by a doctor and as instructed by the provider If you are not better or improved within 7 days, you MUST have a follow up at your doctor or the health department for evaluation. There are other causes of rashes in the genital region.  Thank you for choosing an e-visit.  Your e-visit answers were reviewed by a board certified advanced clinical practitioner to complete your personal care plan. Depending upon the condition, your plan could have included both over the counter or prescription medications.  Please review your pharmacy choice. Make sure the pharmacy is open so you can pick up prescription now. If there is a problem, you may contact your provider through [employer] and have the prescription routed to another pharmacy.  Your safety is important to [place]. If you have drug allergies check your prescription carefully.   For the next 24 hours you can use MyChart to ask questions about today's visit, request a non-urgent call back, or ask for a work or school excuse. You will get an email in the next two days asking about your experience. I hope that your e-visit has been valuable and will speed your recovery.     Greater than 5 minutes, yet less than 10  minutes of time have been spent researching, coordinating and implementing care for this patient today.

## 2020-10-10 ENCOUNTER — Telehealth: Payer: Self-pay | Admitting: Nurse Practitioner

## 2020-10-10 DIAGNOSIS — A6000 Herpesviral infection of urogenital system, unspecified: Secondary | ICD-10-CM

## 2020-10-10 MED ORDER — VALACYCLOVIR HCL 500 MG PO TABS: 500.0000 mg | ORAL_TABLET | Freq: Two times a day (BID) | ORAL | 0 refills | Status: AC

## 2020-10-10 NOTE — Progress Notes (Signed)
E-Visits are not used to request refills.  After reviewing your records, I can verify that you may be running out of a long term medication before your next scheduled appointment.  Based on this information, I can refill your (provider, please free text) on a one time basis.    Please contact your doctor as soon as possible to manage your prescription.  Meds ordered this encounter  Medications   valACYclovir (VALTREX) 500 MG tablet    Sig: Take 1 tablet (500 mg total) by mouth 2 (two) times daily for 3 days.    Dispense:  6 tablet    Refill:  0    Order Specific Question:   Supervising Provider    Answer:   MILLER, BRIAN [[id number]]   5-10 minutes spent reviewing and documenting in chart.

## 2020-10-27 ENCOUNTER — Other Ambulatory Visit: Payer: Self-pay

## 2020-10-27 ENCOUNTER — Telehealth (INDEPENDENT_AMBULATORY_CARE_PROVIDER_SITE_OTHER): Payer: No Payment, Other | Admitting: Physician Assistant

## 2020-10-27 DIAGNOSIS — F4310 Post-traumatic stress disorder, unspecified: Secondary | ICD-10-CM | POA: Diagnosis not present

## 2020-10-27 DIAGNOSIS — G479 Sleep disorder, unspecified: Secondary | ICD-10-CM

## 2020-10-27 DIAGNOSIS — F321 Major depressive disorder, single episode, moderate: Secondary | ICD-10-CM | POA: Diagnosis not present

## 2020-10-27 DIAGNOSIS — F411 Generalized anxiety disorder: Secondary | ICD-10-CM

## 2020-10-27 MED ORDER — TRAZODONE HCL 50 MG PO TABS: 25.0000 mg | ORAL_TABLET | Freq: Every day | ORAL | 1 refills | Status: DC

## 2020-10-27 MED ORDER — ESCITALOPRAM OXALATE 10 MG PO TABS: 10.0000 mg | ORAL_TABLET | Freq: Every day | ORAL | 1 refills | Status: DC

## 2020-10-27 NOTE — Progress Notes (Cosign Needed Addendum)
BH MD/PA/NP OP Progress Note  Virtual Visit via Video Note  I connected with William Caldwell on 10/27/20 at  2:00 PM EDT by a video enabled telemedicine application and verified that I am speaking with the correct person using two identifiers.  Location: Patient: Home Provider: Clinic   I discussed the limitations of evaluation and management by telemedicine and the availability of in person appointments. The patient expressed understanding and agreed to proceed.  Follow Up Instructions:  I discussed the assessment and treatment plan with the patient. The patient was provided an opportunity to ask questions and all were answered. The patient agreed with the plan and demonstrated an understanding of the instructions.   The patient was advised to call back or seek an in-person evaluation if the symptoms worsen or if the condition fails to improve as anticipated.  I provided 18 minutes of non-face-to-face time during this encounter.  Meta Hatchet, PA   10/27/2020 2:11 PM William Caldwell  MRN:  [id number]  Chief Complaint: Follow up and medication management  HPI:   William Caldwell is a 25 year old male with a past psychiatric history significant for major depressive disorder, generalized anxiety disorder, and PTSD who presents to [facility] via virtual video visit for follow-up and medication management.  Patient was placed on the following medication: Sertraline 50 mg daily.  Patient reports that he has been taking his medications as prescribed, however, he notices a weird sensation in his throat when taking the medication.  Patient still endorses the following depressive symptoms: difficulty getting out of bed and sleep disturbances.  He further endorses decreased energy, poor appetite, and difficulty concentrating.  Patient expresses that his anxiety has improved some and that he is not in deep thought as much as before.  Patient rates his anxiety a  3 out of 10.  A PHQ-9 screen was performed with the patient scoring an 18.  A GAD-7 screen was also performed with the patient scoring a 13  Patient is alert and oriented x4, calm, cooperative, and fully engaged in conversation during the encounter.  Patient reports that he is feeling pretty mellow today and is doing well.  Patient denies suicidal or homicidal ideations.  He further denies auditory or visual hallucinations and does not appear to be responding to internal/external stimuli.  Patient endorses poor sleep and states that he has not received good sleep the previous 2 nights.  Patient endorses poor appetite and eats on average 1 meal per day.  He expresses that he just cannot get hungry.  Patient endorses alcohol use and has a beer once or twice a week.  Patient endorses tobacco use and smokes on average 7 cigarettes/day.  Patient denies illicit drug use.  Visit Diagnosis:    ICD-10-CM   1. Major depressive disorder, single episode, moderate (HCC)  F32.1 escitalopram (LEXAPRO) 10 MG tablet    2. Sleep disturbances  G47.9 traZODone (DESYREL) 50 MG tablet    3. Generalized anxiety disorder  F41.1 escitalopram (LEXAPRO) 10 MG tablet    4. Posttraumatic stress disorder  F43.10 escitalopram (LEXAPRO) 10 MG tablet      Past Psychiatric History:  PTSD Insomnia Major depressive disorder Generalized anxiety disorder  Past Medical History:  Past Medical History:  Diagnosis Date   Anxiety    Herpes    History reviewed. No pertinent surgical history.  Family Psychiatric History:  Patient is unsure if there is a family history of psychiatric illness, however, he sees some  of the same patterns that he is dealing with in his siblings.  Family History: History reviewed. No pertinent family history.  Social History:  Social History   Socioeconomic History   Marital status: Single    Spouse name: Not on file   Number of children: Not on file   Years of education: Not on file    Highest education level: Not on file  Occupational History   Not on file  Tobacco Use   Smoking status: Every Day    Packs/day: 0.25    Types: Cigarettes   Smokeless tobacco: Never  Vaping Use   Vaping Use: Never used  Substance and Sexual Activity   Alcohol use: Not Currently    Comment: occassional   Drug use: Not Currently   Sexual activity: Yes    Birth control/protection: Condom  Other Topics Concern   Not on file  Social History Narrative   Not on file   Social Determinants of Health   Financial Resource Strain: Not on file  Food Insecurity: Not on file  Transportation Needs: Not on file  Physical Activity: Not on file  Stress: Not on file  Social Connections: Not on file    Allergies: No Known Allergies  Metabolic Disorder Labs: No results found for: HGBA1C, MPG No results found for: PROLACTIN No results found for: CHOL, TRIG, HDL, CHOLHDL, VLDL, LDLCALC No results found for: TSH  Therapeutic Level Labs: No results found for: LITHIUM No results found for: VALPROATE No components found for:  CBMZ  Current Medications: Current Outpatient Medications  Medication Sig Dispense Refill   escitalopram (LEXAPRO) 10 MG tablet Take 1 tablet (10 mg total) by mouth daily. 30 tablet 1   traZODone (DESYREL) 50 MG tablet Take 0.5 tablets (25 mg total) by mouth at bedtime. Patient to take half tablet by mouth before bedtime. 15 tablet 1   albuterol (VENTOLIN HFA) 108 (90 Base) MCG/ACT inhaler Inhale 1-2 puffs into the lungs every 6 (six) hours as needed for wheezing or shortness of breath. 8 g 0   busPIRone (BUSPAR) 7.5 MG tablet Take 1 tablet (7.5 mg total) by mouth 2 (two) times daily. 60 tablet 1   hydrOXYzine (ATARAX/VISTARIL) 25 MG tablet Take 25 mg by mouth 3 (three) times daily as needed for anxiety.     No current facility-administered medications for this visit.     Musculoskeletal: Strength & Muscle Tone:  Gait & Station:  Patient leans:   Psychiatric  Specialty Exam: Review of Systems  Psychiatric/Behavioral:  Positive for sleep disturbance. Negative for decreased concentration, dysphoric mood, hallucinations, self-injury and suicidal ideas. The patient is nervous/anxious. The patient is not hyperactive.    There were no vitals taken for this visit.There is no height or weight on file to calculate BMI.  General Appearance: Unable to assess due to telemedicine visit  Eye Contact:  Unable to assess due to telemedicine visit  Speech:  Clear and Coherent and Normal Rate  Volume:  Normal  Mood:  Anxious and Depressed  Affect:  Congruent and Depressed  Thought Process:  Coherent, Goal Directed, and Descriptions of Associations: Intact  Orientation:  Full (Time, Place, and Person)  Thought Content: WDL   Suicidal Thoughts:  No  Homicidal Thoughts:  No  Memory:  Immediate;   Good Recent;   Good Remote;   Good  Judgement:  Good  Insight:  Fair  Psychomotor Activity:  Restlessness  Concentration:  Concentration: Good and Attention Span: Good  Recall:  Good  Fund of Knowledge: Good  Language: Good  Akathisia:  NA  Handed:  Left  AIMS (if indicated): not done  Assets:  Communication Skills Desire for Improvement Housing Social Support Vocational/Educational  ADL's:  Intact  Cognition: WNL  Sleep:  Poor   Screenings: GAD-7    Flowsheet Row Video Visit from 10/27/2020 in [facility] Video Visit from 09/09/2020 in [facility] Office Visit from 07/29/2020 in [facility] Office Visit from 01/21/2020 in [facility] Office Visit from 02/28/2018 in [facility] at [facility]  Total GAD-7 Score 13 21 21 17 6       PHQ2-9    Flowsheet Row Video Visit from 10/27/2020 in [facility] Video Visit from 09/09/2020 in [facility] Office Visit from 07/29/2020 in [facility] Counselor from 05/06/2020 in [facility] Office Visit from 01/21/2020 in [facility]  PHQ-2 Total Score 2 5 6 6 3   PHQ-9 Total Score 18 23 24 21 12       Flowsheet Row Video Visit from 10/27/2020 in [facility] Video Visit from 09/09/2020 in [facility] Office Visit from 07/29/2020 in [facility]  C-SSRS RISK CATEGORY Low Risk Low Risk Low Risk        Assessment and Plan:   William Caldwell is a 25 year old male with a past psychiatric history significant for major depressive disorder, generalized anxiety disorder, and PTSD who presents to [facility] via virtual video visit for follow-up and medication management.  Patient endorses adverse reaction from using sertraline.  Patient to discontinue taking sertraline and is to be placed on escitalopram 10 mg daily for the management of his depressive episodes and anxiety.  Patient was also recommended trazodone 25 mg at bedtime for the management of his sleep disturbances.  Patient was agreeable to recommendation.  Patient's medications to be e-prescribed to pharmacy of choice.  1. Major depressive disorder, single episode, moderate (HCC)  - escitalopram (LEXAPRO) 10 MG tablet; Take 1 tablet (10 mg total) by mouth daily.  Dispense: 30 tablet; Refill: 1  2. Sleep disturbances  - traZODone (DESYREL) 50 MG tablet; Take 0.5 tablets (25 mg total) by mouth at bedtime. Patient to take half tablet by mouth before bedtime.  Dispense: 15 tablet; Refill: 1  3. Generalized anxiety disorder  - escitalopram (LEXAPRO) 10 MG tablet; Take 1 tablet (10 mg total) by mouth daily.  Dispense: 30 tablet; Refill: 1  4. Posttraumatic stress disorder  - escitalopram (LEXAPRO) 10 MG tablet; Take 1 tablet (10 mg total) by mouth daily.  Dispense: 30 tablet; Refill: 1  Patient to follow up in 6  weeks Provider spent a total of 18 minutes with the patient/reviewing patient's chart  Meta Hatchet, PA 10/27/2020, 2:11 PM

## 2020-10-28 ENCOUNTER — Encounter (HOSPITAL_COMMUNITY): Payer: Self-pay | Admitting: Physician Assistant

## 2020-11-13 ENCOUNTER — Telehealth: Payer: Self-pay | Admitting: Physician Assistant

## 2020-11-13 DIAGNOSIS — A6001 Herpesviral infection of penis: Secondary | ICD-10-CM

## 2020-11-13 DIAGNOSIS — H60391 Other infective otitis externa, right ear: Secondary | ICD-10-CM

## 2020-11-13 MED ORDER — VALACYCLOVIR HCL 500 MG PO TABS: 500.0000 mg | ORAL_TABLET | Freq: Two times a day (BID) | ORAL | 0 refills | Status: DC

## 2020-11-13 MED ORDER — NEOMYCIN-POLYMYXIN-HC 3.5-10000-1 OT SOLN: 3.0000 [drp] | Freq: Four times a day (QID) | OTIC | 0 refills | Status: DC

## 2020-11-13 NOTE — Progress Notes (Signed)

## 2020-11-13 NOTE — Progress Notes (Signed)
E-Visit for Herpes Simplex  We are sorry that you are not feeling well.  Here is how we plan to help!  Based on what you have shared ith me, it looks like you may be having an outbreak/flare-up of genital herpes.    I have prescribed I have prescribed Valacyclovir 500 mg Take one by mouth twice a day for 3 days.    If you have been prescribed long term medications to be taken on a regular basis, it is important to follow the recommendations and take them as ordered.    Outbreaks usually include blisters and open sores in the genital area. Outbreaks that happen after the first time are usually not as severe and do not last as long. Genital Herpes Simplex is a commonly sexually transmitted viral infection that is found worldwide. Most of these genital infections are caused by one or two herpes simplex viruses that is passed from person to person during vaginal, oral, or anal sex. Sometimes, people do not know they have herpes because they do not have any symptoms.  Please be aware that if you have genital herpes you can be contagious even when you are not having rash or flare-up and you may not have any symptoms, even when you are taking suppressive medicines.  Herpes cannot be cured. The disease usually causes most problems during the first few years. After that, the virus is still there, but it causes few to no symptoms. Even when the virus is active, people with herpes can take medicines to reduce and help prevent symptoms.  Herpes is an infection that can cause blisters and open sores on the genital area. Herpes is caused by a virus that is passed from person to person during vaginal, oral, or anal sex. Sometimes, people do not know they have herpes because they do not have any symptoms. Herpes cannot be cured. The disease usually causes most problems during the first few years. After that, the virus is still there, but it causes few to no symptoms. Even when the virus is active, people with herpes  can take medicines to reduce and help prevent symptoms.  If you have been prescribed medications to be taken on a regular basis, it is important to follow the recommendations and take them as ordered.  Some people with herpes never have any symptoms. But other people can develop symptoms within a few weeks of being infected with the herpes virus   Symptoms usually include blisters in the genital area. In women, this area includes the vagina, buttocks, anus, or thighs. In men, this area includes the penis, scrotum, anus, butt, or thighs. The blisters can become painful open sores, which then crust over as they heal. Sometimes, people can have other symptoms that include:  ?Blisters on the mouth or lips ?Fever, headache, or pain in the joints ?Trouble urinating  Outbreaks might occur every month or more often, or just once or twice a year. Sometimes, people can tell when an outbreak will occur, because they feel itching or pain beforehand. Sometimes they do not know that an outbreak is coming because they have no symptoms. Whatever your pattern is, keep in mind that herpes outbreaks usually become less frequent over time as you get older. Certain things, called "triggers," can make outbreaks more likely to occur. These include stress, sunlight, menstrual periods,or getting sick.  Antiviral therapy can shorten the duration of symptoms and signs in primary infection, which, when untreated, can be associated with significant increase in the   symptoms of the disease.  HOME CARE Use a portable bath (such as a "Sitz bath") where you can sit in warm water for about 20 minutes. Your bathtub could also work. Avoid bubble baths.  Keep the genital area clean and dry and avoid tight clothes.  Take over-the-counter pain medicine such as acetaminophen (brand name: Tylenol) or ibuprofen sample brand names: Advil, Motrin). But avoid aspirin.  Only take medications as instructed by your medical team.  You are  most likely to spread herpes to a sex partner when you have blisters and open sores on your body. But it's also possible to spread herpes to your partner when you do not have any symptoms. That is because herpes can be present on your body without causing any symptoms, like blisters or pain.  Telling your sex partner that you have herpes can be hard. But it can help protect them, since there are ways to lower the risk of spreading the infection.   Using a condom every time you have sex  Not having sex when you have symptoms  Not having oral sex if you have blisters or open sores (in the genital area or around your mouth)  MAKE SURE YOU   Understand these instructions. Do not have sex without using a condom until you have been seen by a doctor and as instructed by the provider If you are not better or improved within 7 days, you MUST have a follow up at your doctor or the health department for evaluation. There are other causes of rashes in the genital region.  Thank you for choosing an e-visit.  Your e-visit answers were reviewed by a board certified advanced clinical practitioner to complete your personal care plan. Depending upon the condition, your plan could have included both over the counter or prescription medications.  Please review your pharmacy choice. Make sure the pharmacy is open so you can pick up prescription now. If there is a problem, you may contact your provider through MyChart messaging and have the prescription routed to another pharmacy.  Your safety is important to us. If you have drug allergies check your prescription carefully.   For the next 24 hours you can use MyChart to ask questions about today's visit, request a non-urgent call back, or ask for a work or school excuse. You will get an email in the next two days asking about your experience. I hope that your e-visit has been valuable and will speed your recovery.   I provided 5 minutes of non face-to-face time  during this encounter for chart review and documentation.   

## 2020-12-07 ENCOUNTER — Telehealth (INDEPENDENT_AMBULATORY_CARE_PROVIDER_SITE_OTHER): Payer: No Payment, Other | Admitting: Physician Assistant

## 2020-12-07 DIAGNOSIS — G479 Sleep disorder, unspecified: Secondary | ICD-10-CM

## 2020-12-07 DIAGNOSIS — F321 Major depressive disorder, single episode, moderate: Secondary | ICD-10-CM

## 2020-12-07 DIAGNOSIS — F4310 Post-traumatic stress disorder, unspecified: Secondary | ICD-10-CM | POA: Diagnosis not present

## 2020-12-07 DIAGNOSIS — F411 Generalized anxiety disorder: Secondary | ICD-10-CM

## 2020-12-07 MED ORDER — QUETIAPINE FUMARATE 50 MG PO TABS: 50.0000 mg | ORAL_TABLET | Freq: Every day | ORAL | 1 refills | Status: DC

## 2020-12-07 MED ORDER — ESCITALOPRAM OXALATE 10 MG PO TABS: 10.0000 mg | ORAL_TABLET | Freq: Every day | ORAL | 1 refills | Status: DC

## 2020-12-10 ENCOUNTER — Encounter (HOSPITAL_COMMUNITY): Payer: Self-pay | Admitting: Physician Assistant

## 2020-12-10 NOTE — Progress Notes (Signed)
BH MD/PA/NP OP Progress Note  Virtual Visit via Telephone Note  I connected with William Caldwell on 12/10/20 at 11:00 AM EDT by telephone and verified that I am speaking with the correct person using two identifiers.  Location: Patient: Home Provider: Clinic   I discussed the limitations, risks, security and privacy concerns of performing an evaluation and management service by telephone and the availability of in person appointments. I also discussed with the patient that there may be a patient responsible charge related to this service. The patient expressed understanding and agreed to proceed.  Follow Up Instructions:   I discussed the assessment and treatment plan with the patient. The patient was provided an opportunity to ask questions and all were answered. The patient agreed with the plan and demonstrated an understanding of the instructions.   The patient was advised to call back or seek an in-person evaluation if the symptoms worsen or if the condition fails to improve as anticipated.  I provided 19 minutes of non-face-to-face time during this encounter.  Meta Hatchet, PA   12/10/2020 8:40 AM William Caldwell  MRN:  [id number]  Chief Complaint: Follow up and medication management  HPI:   William Caldwell is a 25 year old male with a past psychiatric history significant for major depressive disorder, generalized anxiety disorder, and PTSD who presents to [facility] via virtual telephone visit for follow-up and medication management.  Patient is currently being managed on the following medications:  Escitalopram 10 mg daily Trazodone 25 mg at bedtime as needed  Patient reports that he has had a rough last couple of days, but he is continuing to roll with the punches.  He states "I'm stressed and anxious as hell, but I have been trying to take time to myself."  He notes that yesterday was one of the worst days he has experienced over the  past few days.  Patient endorses worsening anxiety he rates a 10 out of 10.  He reports that since taking his Lexapro, he has not noticed a difference in his anxiety.  Patient reports that his anxiety is not always extreme and that it comes in spurts.  He expresses that he may be good one day but then "manic as hell" the next.  Patient believes that his manic episodes have increased since being placed on his medication regimen.  Patient states that he is still up late at night and is unable to sleep, however, he endorses low energy.  He expresses that he occasionally has racing thoughts and may feel overly confident at times, but denies financial extravagance.  Patient endorses the following depressive symptoms: feelings of loneliness, decreased motivation, and irritability.  Patient denies any new stressors at this time.  A PHQ-9 screen was performed with the patient scoring a 19.  A GAD-7 screen was also performed with the patient scoring a 21.  Patient is alert and oriented x4, calm, cooperative, and fully engaged in conversation during the encounter.  Patient endorses moderate mood.  Patient denies suicidal or homicidal ideations.  He further denies auditory or visual hallucinations and does not appear to be responding to internal/external stimuli.  Patient endorses fair sleep and receives on average 6 to 7 hours of intermittent sleep.  He expresses occasionally waking up through the night.  Patient endorses fair appetite and eats on average 1-2 meals per day.  Patient endorses occasional alcohol consumption.  Patient endorses tobacco use and smokes on average 5 to 6 cigarettes every few  hours.  Patient denies illicit drug use.  Visit Diagnosis:    ICD-10-CM   1. Sleep disturbances  G47.9 QUEtiapine (SEROQUEL) 50 MG tablet    2. Major depressive disorder, single episode, moderate (HCC)  F32.1 escitalopram (LEXAPRO) 10 MG tablet    QUEtiapine (SEROQUEL) 50 MG tablet    3. Generalized anxiety disorder   F41.1 escitalopram (LEXAPRO) 10 MG tablet    4. Posttraumatic stress disorder  F43.10 escitalopram (LEXAPRO) 10 MG tablet      Past Psychiatric History:  PTSD Insomnia Major depressive disorder Generalized anxiety disorder  Past Medical History:  Past Medical History:  Diagnosis Date   Anxiety    Herpes    No past surgical history on file.  Family Psychiatric History:  Patient is unsure if there is a family history of psychiatric illness, however, he sees some of the same patterns that he is dealing with in his siblings.  Family History: No family history on file.  Social History:  Social History   Socioeconomic History   Marital status: Single    Spouse name: Not on file   Number of children: Not on file   Years of education: Not on file   Highest education level: Not on file  Occupational History   Not on file  Tobacco Use   Smoking status: Every Day    Packs/day: 0.25    Types: Cigarettes   Smokeless tobacco: Never  Vaping Use   Vaping Use: Never used  Substance and Sexual Activity   Alcohol use: Not Currently    Comment: occassional   Drug use: Not Currently   Sexual activity: Yes    Birth control/protection: Condom  Other Topics Concern   Not on file  Social History Narrative   Not on file   Social Determinants of Health   Financial Resource Strain: Not on file  Food Insecurity: Not on file  Transportation Needs: Not on file  Physical Activity: Not on file  Stress: Not on file  Social Connections: Not on file    Allergies: No Known Allergies  Metabolic Disorder Labs: No results found for: HGBA1C, MPG No results found for: PROLACTIN No results found for: CHOL, TRIG, HDL, CHOLHDL, VLDL, LDLCALC No results found for: TSH  Therapeutic Level Labs: No results found for: LITHIUM No results found for: VALPROATE No components found for:  CBMZ  Current Medications: Current Outpatient Medications  Medication Sig Dispense Refill   QUEtiapine  (SEROQUEL) 50 MG tablet Take 1 tablet (50 mg total) by mouth at bedtime. 30 tablet 1   albuterol (VENTOLIN HFA) 108 (90 Base) MCG/ACT inhaler Inhale 1-2 puffs into the lungs every 6 (six) hours as needed for wheezing or shortness of breath. 8 g 0   busPIRone (BUSPAR) 7.5 MG tablet Take 1 tablet (7.5 mg total) by mouth 2 (two) times daily. 60 tablet 1   escitalopram (LEXAPRO) 10 MG tablet Take 1 tablet (10 mg total) by mouth daily. 30 tablet 1   hydrOXYzine (ATARAX/VISTARIL) 25 MG tablet Take 25 mg by mouth 3 (three) times daily as needed for anxiety.     neomycin-polymyxin-hydrocortisone (CORTISPORIN) OTIC solution Place 3 drops into the right ear 4 (four) times daily. 10 mL 0   valACYclovir (VALTREX) 500 MG tablet Take 1 tablet (500 mg total) by mouth 2 (two) times daily. 6 tablet 0   No current facility-administered medications for this visit.     Musculoskeletal: Strength & Muscle Tone: Unable to assess due to telemedicine visit Gait &  Station: Unable to assess due to telemedicine visit Patient leans: Unable to assess due to telemedicine visit  Psychiatric Specialty Exam: Review of Systems  Psychiatric/Behavioral:  Positive for sleep disturbance. Negative for decreased concentration, dysphoric mood, hallucinations, self-injury and suicidal ideas. The patient is nervous/anxious. The patient is not hyperactive.    There were no vitals taken for this visit.There is no height or weight on file to calculate BMI.  General Appearance: Unable to assess due to telemedicine visit  Eye Contact: Unable to assess due to telemedicine visit  Speech:  Clear and Coherent and Normal Rate  Volume:  Normal  Mood:  Anxious and Depressed  Affect:  Congruent and Depressed  Thought Process:  Coherent, Goal Directed, and Descriptions of Associations: Intact  Orientation:  Full (Time, Place, and Person)  Thought Content: WDL   Suicidal Thoughts:  No  Homicidal Thoughts:  No  Memory:  Immediate;    Good Recent;   Good Remote;   Good  Judgement:  Good  Insight:  Fair  Psychomotor Activity:  Restlessness  Concentration:  Concentration: Good and Attention Span: Good  Recall:  Good  Fund of Knowledge: Good  Language: Good  Akathisia:  NA  Handed:  Left  AIMS (if indicated): not done  Assets:  Communication Skills Desire for Improvement Housing Social Support Vocational/Educational  ADL's:  Intact  Cognition: WNL  Sleep:  Fair   Screenings: GAD-7    Flowsheet Row Video Visit from 12/07/2020 in [facility] Video Visit from 10/27/2020 in [facility] Video Visit from 09/09/2020 in [facility] Office Visit from 07/29/2020 in [facility] Office Visit from 01/21/2020 in [facility]  Total GAD-7 Score 21 13 21 21 17       PHQ2-9    Flowsheet Row Video Visit from 12/07/2020 in [facility] Video Visit from 10/27/2020 in [facility] Video Visit from 09/09/2020 in [facility] Office Visit from 07/29/2020 in [facility] Counselor from 05/06/2020 in [facility]  PHQ-2 Total Score 4 2 5 6 6   PHQ-9 Total Score 19 18 23 24 21       Flowsheet Row Video Visit from 12/07/2020 in [facility] Video Visit from 10/27/2020 in [facility] Video Visit from 09/09/2020 in [facility]  C-SSRS RISK CATEGORY Low Risk Low Risk Low Risk        Assessment and Plan:   William Caldwell is a 25 year old male with a past psychiatric history significant for major depressive disorder, generalized anxiety disorder, and PTSD who presents to [facility] clinic via virtual telephone visit for follow-up and medication management.  Patient continues to  endorse worsening anxiety and depression.  In addition to his anxiety/depression, patient notes experiencing racing thoughts, sleep disturbances, and feeling overly confident at times.  Patient's newly reported symptoms are suggestive of mild manic symptoms related to bipolar disorder.  Patient was encouraged to continue taking escitalopram and was recommended Seroquel 50 mg at bedtime for the management of his sleep disturbances and stabilization of mood.  Patient was recommended discontinuing trazodone 5 mg at bedtime.  Patient was agreeable to recommendation.  Patient's medication to be e-prescribed to pharmacy of choice.  1. Major depressive disorder, single episode, moderate (HCC)  - escitalopram (LEXAPRO) 10 MG tablet; Take 1 tablet (10 mg  total) by mouth daily.  Dispense: 30 tablet; Refill: 1 - QUEtiapine (SEROQUEL) 50 MG tablet; Take 1 tablet (50 mg total) by mouth at bedtime.  Dispense: 30 tablet; Refill: 1  2. Generalized anxiety disorder  - escitalopram (LEXAPRO) 10 MG tablet; Take 1 tablet (10 mg total) by mouth daily.  Dispense: 30 tablet; Refill: 1  3. Posttraumatic stress disorder  - escitalopram (LEXAPRO) 10 MG tablet; Take 1 tablet (10 mg total) by mouth daily.  Dispense: 30 tablet; Refill: 1  4. Sleep disturbances  - QUEtiapine (SEROQUEL) 50 MG tablet; Take 1 tablet (50 mg total) by mouth at bedtime.  Dispense: 30 tablet; Refill: 1  Patient to follow up in 7 weeks Provider spent a total of 19 minutes with the patient/reviewing patient's chart  Meta Hatchet, PA 12/10/2020, 8:40 AM

## 2021-01-18 ENCOUNTER — Telehealth: Payer: Self-pay | Admitting: Physician Assistant

## 2021-01-18 DIAGNOSIS — R21 Rash and other nonspecific skin eruption: Secondary | ICD-10-CM

## 2021-01-18 DIAGNOSIS — A6000 Herpesviral infection of urogenital system, unspecified: Secondary | ICD-10-CM

## 2021-01-18 MED ORDER — TRIAMCINOLONE ACETONIDE 0.1 % EX CREA: 1.0000 "application " | TOPICAL_CREAM | Freq: Two times a day (BID) | CUTANEOUS | 0 refills | Status: DC

## 2021-01-18 MED ORDER — VALACYCLOVIR HCL 500 MG PO TABS: 500.0000 mg | ORAL_TABLET | Freq: Two times a day (BID) | ORAL | 0 refills | Status: AC

## 2021-01-18 NOTE — Progress Notes (Signed)
E-Visit for Herpes Simplex  We are sorry that you are not feeling well.  Here is how we plan to help!  Based on what you have shared ith me, it looks like you may be having an outbreak/flare-up of genital herpes.    I have prescribed I have prescribed Valacyclovir 500 mg Take one by mouth twice a day for 3 days.    If you have been prescribed long term medications to be taken on a regular basis, it is important to follow the recommendations and take them as ordered.    Outbreaks usually include blisters and open sores in the genital area. Outbreaks that happen after the first time are usually not as severe and do not last as long. Genital Herpes Simplex is a commonly sexually transmitted viral infection that is found worldwide. Most of these genital infections are caused by one or two herpes simplex viruses that is passed from person to person during vaginal, oral, or anal sex. Sometimes, people do not know they have herpes because they do not have any symptoms.  Please be aware that if you have genital herpes you can be contagious even when you are not having rash or flare-up and you may not have any symptoms, even when you are taking suppressive medicines.  Herpes cannot be cured. The disease usually causes most problems during the first few years. After that, the virus is still there, but it causes few to no symptoms. Even when the virus is active, people with herpes can take medicines to reduce and help prevent symptoms.  Herpes is an infection that can cause blisters and open sores on the genital area. Herpes is caused by a virus that is passed from person to person during vaginal, oral, or anal sex. Sometimes, people do not know they have herpes because they do not have any symptoms. Herpes cannot be cured. The disease usually causes most problems during the first few years. After that, the virus is still there, but it causes few to no symptoms. Even when the virus is active, people with herpes  can take medicines to reduce and help prevent symptoms.  If you have been prescribed medications to be taken on a regular basis, it is important to follow the recommendations and take them as ordered.  Some people with herpes never have any symptoms. But other people can develop symptoms within a few weeks of being infected with the herpes virus   Symptoms usually include blisters in the genital area. In women, this area includes the vagina, buttocks, anus, or thighs. In men, this area includes the penis, scrotum, anus, butt, or thighs. The blisters can become painful open sores, which then crust over as they heal. Sometimes, people can have other symptoms that include:  ?Blisters on the mouth or lips ?Fever, headache, or pain in the joints ?Trouble urinating  Outbreaks might occur every month or more often, or just once or twice a year. Sometimes, people can tell when an outbreak will occur, because they feel itching or pain beforehand. Sometimes they do not know that an outbreak is coming because they have no symptoms. Whatever your pattern is, keep in mind that herpes outbreaks usually become less frequent over time as you get older. Certain things, called "triggers," can make outbreaks more likely to occur. These include stress, sunlight, menstrual periods,or getting sick.  Antiviral therapy can shorten the duration of symptoms and signs in primary infection, which, when untreated, can be associated with significant increase in the   symptoms of the disease.  HOME CARE Use a portable bath (such as a "Sitz bath") where you can sit in warm water for about 20 minutes. Your bathtub could also work. Avoid bubble baths.  Keep the genital area clean and dry and avoid tight clothes.  Take over-the-counter pain medicine such as acetaminophen (brand name: Tylenol) or ibuprofen sample brand names: Advil, Motrin). But avoid aspirin.  Only take medications as instructed by your medical team.  You are  most likely to spread herpes to a sex partner when you have blisters and open sores on your body. But it's also possible to spread herpes to your partner when you do not have any symptoms. That is because herpes can be present on your body without causing any symptoms, like blisters or pain.  Telling your sex partner that you have herpes can be hard. But it can help protect them, since there are ways to lower the risk of spreading the infection.   Using a condom every time you have sex  Not having sex when you have symptoms  Not having oral sex if you have blisters or open sores (in the genital area or around your mouth)  MAKE SURE YOU   Understand these instructions. Do not have sex without using a condom until you have been seen by a doctor and as instructed by the provider If you are not better or improved within 7 days, you MUST have a follow up at your doctor or the health department for evaluation. There are other causes of rashes in the genital region.  Thank you for choosing an e-visit.  Your e-visit answers were reviewed by a board certified advanced clinical practitioner to complete your personal care plan. Depending upon the condition, your plan could have included both over the counter or prescription medications.  Please review your pharmacy choice. Make sure the pharmacy is open so you can pick up prescription now. If there is a problem, you may contact your provider through MyChart messaging and have the prescription routed to another pharmacy.  Your safety is important to us. If you have drug allergies check your prescription carefully.   For the next 24 hours you can use MyChart to ask questions about today's visit, request a non-urgent call back, or ask for a work or school excuse. You will get an email in the next two days asking about your experience. I hope that your e-visit has been valuable and will speed your recovery.  Approximately 5 minutes was spent documenting  and reviewing patient's chart.     

## 2021-01-18 NOTE — Progress Notes (Signed)
E Visit for Rash  We are sorry that you are not feeling well. Here is how we plan to help!  I have prescribed a stronger steroid cream to help with your symptoms called triamcinolone.   HOME CARE:  Take cool showers and avoid direct sunlight. Apply cool compress or wet dressings. Take a bath in an oatmeal bath.  Sprinkle content of one Aveeno packet under running faucet with comfortably warm water.  Bathe for 15-20 minutes, 1-2 times daily.  Pat dry with a towel. Do not rub the rash. Use hydrocortisone cream. Take an antihistamine like Benadryl for widespread rashes that itch.  The adult dose of Benadryl is 25-50 mg by mouth 4 times daily. Caution:  This type of medication may cause sleepiness.  Do not drink alcohol, drive, or operate dangerous machinery while taking antihistamines.  Do not take these medications if you have prostate enlargement.  Read package instructions thoroughly on all medications that you take.  GET HELP RIGHT AWAY IF:  Symptoms don't go away after treatment. Severe itching that persists. If you rash spreads or swells. If you rash begins to smell. If it blisters and opens or develops a yellow-brown crust. You develop a fever. You have a sore throat. You become short of breath.  MAKE SURE YOU:  Understand these instructions. Will watch your condition. Will get help right away if you are not doing well or get worse.  Thank you for choosing an e-visit.  Your e-visit answers were reviewed by a board certified advanced clinical practitioner to complete your personal care plan. Depending upon the condition, your plan could have included both over the counter or prescription medications.  Please review your pharmacy choice. Make sure the pharmacy is open so you can pick up prescription now. If there is a problem, you may contact your provider through [employer] and have the prescription routed to another pharmacy.  Your safety is important to [place]. If you  have drug allergies check your prescription carefully.   For the next 24 hours you can use MyChart to ask questions about today's visit, request a non-urgent call back, or ask for a work or school excuse. You will get an email in the next two days asking about your experience. I hope that your e-visit has been valuable and will speed your recovery.  Approximately 5 minutes was spent documenting and reviewing patient's chart.

## 2021-02-03 ENCOUNTER — Telehealth (INDEPENDENT_AMBULATORY_CARE_PROVIDER_SITE_OTHER): Payer: No Payment, Other | Admitting: Physician Assistant

## 2021-02-03 ENCOUNTER — Encounter (HOSPITAL_COMMUNITY): Payer: Self-pay | Admitting: Physician Assistant

## 2021-02-03 DIAGNOSIS — F411 Generalized anxiety disorder: Secondary | ICD-10-CM | POA: Diagnosis not present

## 2021-02-03 DIAGNOSIS — F321 Major depressive disorder, single episode, moderate: Secondary | ICD-10-CM | POA: Diagnosis not present

## 2021-02-03 DIAGNOSIS — F4310 Post-traumatic stress disorder, unspecified: Secondary | ICD-10-CM

## 2021-02-03 DIAGNOSIS — G479 Sleep disorder, unspecified: Secondary | ICD-10-CM

## 2021-02-03 MED ORDER — ESCITALOPRAM OXALATE 20 MG PO TABS: 20.0000 mg | ORAL_TABLET | Freq: Every day | ORAL | 1 refills | Status: DC

## 2021-02-03 MED ORDER — TRAZODONE HCL 50 MG PO TABS: 50.0000 mg | ORAL_TABLET | Freq: Every day | ORAL | 1 refills | Status: DC

## 2021-02-03 MED ORDER — LAMOTRIGINE 25 MG PO TABS: 25.0000 mg | ORAL_TABLET | Freq: Every day | ORAL | 1 refills | Status: DC

## 2021-02-03 NOTE — Progress Notes (Addendum)
BH MD/PA/NP OP Progress Note  Virtual Visit via Telephone Note  I connected with William Caldwell on 02/03/21 at 11:00 AM EST by telephone and verified that I am speaking with the correct person using two identifiers.  Location: Patient: Home Provider: Clinic   I discussed the limitations, risks, security and privacy concerns of performing an evaluation and management service by telephone and the availability of in person appointments. I also discussed with the patient that there may be a patient responsible charge related to this service. The patient expressed understanding and agreed to proceed.  Follow Up Instructions:   I discussed the assessment and treatment plan with the patient. The patient was provided an opportunity to ask questions and all were answered. The patient agreed with the plan and demonstrated an understanding of the instructions.   The patient was advised to call back or seek an in-person evaluation if the symptoms worsen or if the condition fails to improve as anticipated.  I provided 18 minutes of non-face-to-face time during this encounter.  William Hatchet, PA   02/03/2021 2:29 PM William Caldwell  MRN:  [id number]  Chief Complaint: Follow up and medication management  HPI:   William Caldwell is a 25 year old male with a past psychiatric history significant for generalized anxiety disorder, PTSD, major depressive disorder and sleep disturbances who presents to [facility] via virtual telephone visit for follow-up and medication management.  Patient is currently being managed on the following medications:  Escitalopram (Lexapro) 10 mg daily Seroquel 50 mg at bedtime  Patient reports that his use of Seroquel makes him drowsy which he says could potentially be detrimental due to working as a [employer].  Patient still endorses anxiety even while taking Seroquel.  He states that his anxiety is at moderate level and  attributes his anxiety to ruminating and beating himself up over mistakes he has made in the past.  He describes his anxiety as a cloud that continues to follow him.  Patient also notes that he has been feeling very hypersexual stating that he has experienced irritability and difficulty sleeping due to his increased libido.  Patient states that his depressive episodes are mild stating that keeping busy with work helps to keep him grounded.  Patient reports that he is more concerned with a variety of other symptoms he has been experiencing as of late.  Patient states that he feels like he has very little energy and has been experiencing itchy skin and abdominal pain.  Provider recommended patient be set up with a primary care provider to address these concerns.  Patient was apprehensive at first was being set up with the provider but eventually agreed to establishing care with a PCP.  Patient was provided information for [facility].  A PHQ-9 screen was performed with the patient scoring a 20.  A GAD-7 screen was also performed with the patient scoring a 21.  Patient is alert and oriented x4, calm, cooperative, and fully engaged in conversation during the encounter.  Patient states that his mood is currently at a moderate level but states that he is all right.  Patient denies suicidal or homicidal ideations.  He further denies auditory or visual hallucinations and does not appear to be responding to internal/external stimuli.  Patient endorses fair sleep and receives on average 5 to 6 hours of intermittent sleep per night.  Patient endorses decreased appetite still stating that he eats roughly 1 meal per day.  Patient endorses moderate alcohol consumption stating that he has been drinking more lately.  Patient denies his alcohol consumption being excessive.  He states that he has a drink every day.  Patient endorses tobacco use and smokes on average 6 cigarettes/day.  Patient  endorses illicit drug use in the form of marijuana  Visit Diagnosis:    ICD-10-CM   1. Generalized anxiety disorder  F41.1 escitalopram (LEXAPRO) 20 MG tablet    2. Posttraumatic stress disorder  F43.10 escitalopram (LEXAPRO) 20 MG tablet    3. Major depressive disorder, single episode, moderate (HCC)  F32.1 escitalopram (LEXAPRO) 20 MG tablet    lamoTRIgine (LAMICTAL) 25 MG tablet    4. Sleep disturbances  G47.9 traZODone (DESYREL) 50 MG tablet      Past Psychiatric History:  Generalized anxiety disorder PTSD Major depressive disorder Sleep disturbances  Past Medical History:  Past Medical History:  Diagnosis Date   Anxiety    Herpes    History reviewed. No pertinent surgical history.  Family Psychiatric History:  Patient is unsure if there is a family history of psychiatric illness, however, he sees some of the same patterns that he is dealing with in his siblings.  Family History: History reviewed. No pertinent family history.  Social History:  Social History   Socioeconomic History   Marital status: Single    Spouse name: Not on file   Number of children: Not on file   Years of education: Not on file   Highest education level: Not on file  Occupational History   Not on file  Tobacco Use   Smoking status: Every Day    Packs/day: 0.25    Types: Cigarettes   Smokeless tobacco: Never  Vaping Use   Vaping Use: Never used  Substance and Sexual Activity   Alcohol use: Not Currently    Comment: occassional   Drug use: Not Currently   Sexual activity: Yes    Birth control/protection: Condom  Other Topics Concern   Not on file  Social History Narrative   Not on file   Social Determinants of Health   Financial Resource Strain: Not on file  Food Insecurity: Not on file  Transportation Needs: Not on file  Physical Activity: Not on file  Stress: Not on file  Social Connections: Not on file    Allergies: No Known Allergies  Metabolic Disorder Labs: No  results found for: HGBA1C, MPG No results found for: PROLACTIN No results found for: CHOL, TRIG, HDL, CHOLHDL, VLDL, LDLCALC No results found for: TSH  Therapeutic Level Labs: No results found for: LITHIUM No results found for: VALPROATE No components found for:  CBMZ  Current Medications: Current Outpatient Medications  Medication Sig Dispense Refill   [START ON 02/05/2021] lamoTRIgine (LAMICTAL) 25 MG tablet Take 1 tablet (25 mg total) by mouth daily. 30 tablet 1   [START ON 02/05/2021] traZODone (DESYREL) 50 MG tablet Take 1 tablet (50 mg total) by mouth at bedtime. 30 tablet 1   albuterol (VENTOLIN HFA) 108 (90 Base) MCG/ACT inhaler Inhale 1-2 puffs into the lungs every 6 (six) hours as needed for wheezing or shortness of breath. 8 g 0   busPIRone (BUSPAR) 7.5 MG tablet Take 1 tablet (7.5 mg total) by mouth 2 (two) times daily. 60 tablet 1   [START ON 02/05/2021] escitalopram (LEXAPRO) 20 MG tablet Take 1 tablet (20 mg total) by mouth daily. 30 tablet 1   hydrOXYzine (ATARAX/VISTARIL) 25 MG tablet Take 25 mg by mouth 3 (three) times  daily as needed for anxiety.     neomycin-polymyxin-hydrocortisone (CORTISPORIN) OTIC solution Place 3 drops into the right ear 4 (four) times daily. 10 mL 0   QUEtiapine (SEROQUEL) 50 MG tablet Take 1 tablet (50 mg total) by mouth at bedtime. 30 tablet 1   triamcinolone cream (KENALOG) 0.1 % Apply 1 application topically 2 (two) times daily. 30 g 0   valACYclovir (VALTREX) 500 MG tablet Take 1 tablet (500 mg total) by mouth 2 (two) times daily. 6 tablet 0   No current facility-administered medications for this visit.     Musculoskeletal: Strength & Muscle Tone: Unable to assess due to telemedicine visit Gait & Station: Unable to assess due to telemedicine visit Patient leans: Unable to assess due to telemedicine visit  Psychiatric Specialty Exam: Review of Systems  Psychiatric/Behavioral:  Positive for sleep disturbance. Negative for decreased  concentration, dysphoric mood, hallucinations, self-injury and suicidal ideas. The patient is nervous/anxious. The patient is not hyperactive.    There were no vitals taken for this visit.There is no height or weight on file to calculate BMI.  General Appearance: Unable to assess due to telemedicine visit  Eye Contact:  Unable to assess due to telemedicine visit  Speech:  Clear and Coherent and Normal Rate  Volume:  Normal  Mood:  Anxious and Irritable  Affect:  Congruent  Thought Process:  Coherent, Goal Directed, and Descriptions of Associations: Intact  Orientation:  Full (Time, Place, and Person)  Thought Content: WDL and Rumination   Suicidal Thoughts:  No  Homicidal Thoughts:  No  Memory:  Immediate;   Good Recent;   Good Remote;   Good  Judgement:  Good  Insight:  Fair  Psychomotor Activity:  Restlessness  Concentration:  Concentration: Good and Attention Span: Good  Recall:  Good  Fund of Knowledge: Good  Language: Good  Akathisia:  NA  Handed:  Left  AIMS (if indicated): not done  Assets:  Communication Skills Desire for Improvement Housing Social Support Vocational/Educational  ADL's:  Intact  Cognition: WNL  Sleep:  Fair   Screenings: GAD-7    Flowsheet Row Video Visit from 02/03/2021 in [facility] Video Visit from 12/07/2020 in [facility] Video Visit from 10/27/2020 in [facility] Video Visit from 09/09/2020 in [facility] Office Visit from 07/29/2020 in [facility]  Total GAD-7 Score 21 21 13 21 21       PHQ2-9    Flowsheet Row Video Visit from 02/03/2021 in [facility] Video Visit from 12/07/2020 in [facility] Video Visit from 10/27/2020 in [facility] Video Visit from 09/09/2020 in [facility] Office Visit  from 07/29/2020 in [facility]  PHQ-2 Total Score 4 4 2 5 6   PHQ-9 Total Score 20 19 18 23 24       Flowsheet Row Video Visit from 02/03/2021 in [facility] Video Visit from 12/07/2020 in [facility] Video Visit from 10/27/2020 in [facility]  C-SSRS RISK CATEGORY Low Risk Low Risk Low Risk        Assessment and Plan:   William Caldwell is a 25 year old male with a past psychiatric history significant for generalized anxiety disorder, PTSD, major depressive disorder and sleep disturbances who presents to [facility] via virtual telephone visit for follow-up and  medication management.  Patient continues to have issues with his sleep habits and states that Seroquel causes him to feel drowsy upon waking.  Patient was recommended trazodone 50 mg at bedtime for management of his sleep.  Patient still continues to endorse anxiety with some mild depressive symptoms.  Patient was recommended adjusting his dosage of Lexapro from 10 mg to 20 mg daily for the management of his anxiety and depression.  Lastly patient was placed on lamotrigine 25 mg daily for the management of his irritability.  Patient was agreeable to recommendations.  Patient's medications to be e- prescribed to pharmacy of choice.  Patient was provided the address for [facility].  1. Generalized anxiety disorder  - escitalopram (LEXAPRO) 20 MG tablet; Take 1 tablet (20 mg total) by mouth daily.  Dispense: 30 tablet; Refill: 1  2. Posttraumatic stress disorder  - escitalopram (LEXAPRO) 20 MG tablet; Take 1 tablet (20 mg total) by mouth daily.  Dispense: 30 tablet; Refill: 1  3. Major depressive disorder, single episode, moderate (HCC)  - escitalopram (LEXAPRO) 20 MG tablet; Take 1 tablet (20 mg total) by mouth daily.  Dispense: 30 tablet; Refill: 1 -  lamoTRIgine (LAMICTAL) 25 MG tablet; Take 1 tablet (25 mg total) by mouth daily.  Dispense: 30 tablet; Refill: 1  4. Sleep disturbances  - traZODone (DESYREL) 50 MG tablet; Take 1 tablet (50 mg total) by mouth at bedtime.  Dispense: 30 tablet; Refill: 1  Patient to follow up in 2 months Provider spent a total of 18 minutes with the patient/reviewing the patient's chart  William Hatchet, PA 02/03/2021, 2:29 PM

## 2021-02-04 ENCOUNTER — Telehealth: Payer: Self-pay | Admitting: Physician Assistant

## 2021-02-04 DIAGNOSIS — A6001 Herpesviral infection of penis: Secondary | ICD-10-CM

## 2021-02-04 MED ORDER — VALACYCLOVIR HCL 500 MG PO TABS: 500.0000 mg | ORAL_TABLET | Freq: Two times a day (BID) | ORAL | 0 refills | Status: DC

## 2021-02-04 NOTE — Progress Notes (Signed)
I have spent 5 minutes in review of e-visit questionnaire, review and updating patient chart, medical decision making and response to patient.   Morgann Woodburn Cody Amad Mau, PA-C    

## 2021-02-04 NOTE — Progress Notes (Signed)
E-Visit for Herpes Simplex  We are sorry that you are not feeling well.  Here is how we plan to help!  Based on what you have shared ith me, it looks like you may be having an outbreak/flare-up of genital herpes.    I have prescribed I have prescribed Valacyclovir 500 mg Take one by mouth twice a day for 3 days.    If you have been prescribed long term medications to be taken on a regular basis, it is important to follow the recommendations and take them as ordered.    Outbreaks usually include blisters and open sores in the genital area. Outbreaks that happen after the first time are usually not as severe and do not last as long. Genital Herpes Simplex is a commonly sexually transmitted viral infection that is found worldwide. Most of these genital infections are caused by one or two herpes simplex viruses that is passed from person to person during vaginal, oral, or anal sex. Sometimes, people do not know they have herpes because they do not have any symptoms.  Please be aware that if you have genital herpes you can be contagious even when you are not having rash or flare-up and you may not have any symptoms, even when you are taking suppressive medicines.  Herpes cannot be cured. The disease usually causes most problems during the first few years. After that, the virus is still there, but it causes few to no symptoms. Even when the virus is active, people with herpes can take medicines to reduce and help prevent symptoms.  Herpes is an infection that can cause blisters and open sores on the genital area. Herpes is caused by a virus that is passed from person to person during vaginal, oral, or anal sex. Sometimes, people do not know they have herpes because they do not have any symptoms. Herpes cannot be cured. The disease usually causes most problems during the first few years. After that, the virus is still there, but it causes few to no symptoms. Even when the virus is active, people with herpes  can take medicines to reduce and help prevent symptoms.  If you have been prescribed medications to be taken on a regular basis, it is important to follow the recommendations and take them as ordered.  Some people with herpes never have any symptoms. But other people can develop symptoms within a few weeks of being infected with the herpes virus   Symptoms usually include blisters in the genital area. In women, this area includes the vagina, buttocks, anus, or thighs. In men, this area includes the penis, scrotum, anus, butt, or thighs. The blisters can become painful open sores, which then crust over as they heal. Sometimes, people can have other symptoms that include:  ?Blisters on the mouth or lips ?Fever, headache, or pain in the joints ?Trouble urinating  Outbreaks might occur every month or more often, or just once or twice a year. Sometimes, people can tell when an outbreak will occur, because they feel itching or pain beforehand. Sometimes they do not know that an outbreak is coming because they have no symptoms. Whatever your pattern is, keep in mind that herpes outbreaks usually become less frequent over time as you get older. Certain things, called "triggers," can make outbreaks more likely to occur. These include stress, sunlight, menstrual periods,or getting sick.  Antiviral therapy can shorten the duration of symptoms and signs in primary infection, which, when untreated, can be associated with significant increase in the   symptoms of the disease.  HOME CARE Use a portable bath (such as a "Sitz bath") where you can sit in warm water for about 20 minutes. Your bathtub could also work. Avoid bubble baths.  Keep the genital area clean and dry and avoid tight clothes.  Take over-the-counter pain medicine such as acetaminophen (brand name: Tylenol) or ibuprofen sample brand names: Advil, Motrin). But avoid aspirin.  Only take medications as instructed by your medical team.  You are  most likely to spread herpes to a sex partner when you have blisters and open sores on your body. But it's also possible to spread herpes to your partner when you do not have any symptoms. That is because herpes can be present on your body without causing any symptoms, like blisters or pain.  Telling your sex partner that you have herpes can be hard. But it can help protect them, since there are ways to lower the risk of spreading the infection.   Using a condom every time you have sex  Not having sex when you have symptoms  Not having oral sex if you have blisters or open sores (in the genital area or around your mouth)  MAKE SURE YOU   Understand these instructions. Do not have sex without using a condom until you have been seen by a doctor and as instructed by the provider If you are not better or improved within 7 days, you MUST have a follow up at your doctor or the health department for evaluation. There are other causes of rashes in the genital region.  Thank you for choosing an e-visit.  Your e-visit answers were reviewed by a board certified advanced clinical practitioner to complete your personal care plan. Depending upon the condition, your plan could have included both over the counter or prescription medications.  Please review your pharmacy choice. Make sure the pharmacy is open so you can pick up prescription now. If there is a problem, you may contact your provider through MyChart messaging and have the prescription routed to another pharmacy.  Your safety is important to us. If you have drug allergies check your prescription carefully.   For the next 24 hours you can use MyChart to ask questions about today's visit, request a non-urgent call back, or ask for a work or school excuse. You will get an email in the next two days asking about your experience. I hope that your e-visit has been valuable and will speed your recovery.      

## 2021-02-08 ENCOUNTER — Telehealth: Payer: Self-pay | Admitting: Nurse Practitioner

## 2021-02-08 DIAGNOSIS — R12 Heartburn: Secondary | ICD-10-CM

## 2021-02-08 MED ORDER — OMEPRAZOLE 20 MG PO CPDR: 20.0000 mg | DELAYED_RELEASE_CAPSULE | Freq: Every day | ORAL | 3 refills | Status: DC

## 2021-02-08 NOTE — Progress Notes (Signed)
E-Visit for Heartburn  We are sorry that you are not feeling well.  Here is how we plan to help!  Based on what you shared with me it looks like you most likely have Gastroesophageal Reflux Disease (GERD)  Gastroesophageal reflux disease (GERD) happens when acid from your stomach flows up into the esophagus.  When acid comes in contact with the esophagus, the acid causes sorenss (inflammation) in the esophagus.  Over time, GERD may create small holes (ulcers) in the lining of the esophagus.  I have prescribed Omeprazole 20 mg one by mouth daily until you follow up with a provider.  Your symptoms should improve in the next day or two.  You can use antacids as needed until symptoms resolve.  Call us if your heartburn worsens, you have trouble swallowing, weight loss, spitting up blood or recurrent vomiting.  Home Care: May include lifestyle changes such as weight loss, quitting smoking and alcohol consumption Avoid foods and drinks that make your symptoms worse, such as: Caffeine or alcoholic drinks Chocolate Peppermint or mint flavorings Garlic and onions Spicy foods Citrus fruits, such as oranges, lemons, or limes Tomato-based foods such as sauce, chili, salsa and pizza Fried and fatty foods Avoid lying down for 3 hours prior to your bedtime or prior to taking a nap Eat small, frequent meals instead of a large meals Wear loose-fitting clothing.  Do not wear anything tight around your waist that causes pressure on your stomach. Raise the head of your bed 6 to 8 inches with wood blocks to help you sleep.  Extra pillows will not help.  Seek Help Right Away If: You have pain in your arms, neck, jaw, teeth or back Your pain increases or changes in intensity or duration You develop nausea, vomiting or sweating (diaphoresis) You develop shortness of breath or you faint Your vomit is green, yellow, black or looks like coffee grounds or blood Your stool is red, bloody or black  These  symptoms could be signs of other problems, such as heart disease, gastric bleeding or esophageal bleeding.  Make sure you : Understand these instructions. Will watch your condition. Will get help right away if you are not doing well or get worse.  Your e-visit answers were reviewed by a board certified advanced clinical practitioner to complete your personal care plan.  Depending on the condition, your plan could have included both over the counter or prescription medications.  If there is a problem please reply  once you have received a response from your provider.  Your safety is important to us.  If you have drug allergies check your prescription carefully.    You can use MyChart to ask questions about today's visit, request a non-urgent call back, or ask for a work or school excuse for 24 hours related to this e-Visit. If it has been greater than 24 hours you will need to follow up with your provider, or enter a new e-Visit to address those concerns.  You will get an e-mail in the next two days asking about your experience.  I hope that your e-visit has been valuable and will speed your recovery. Thank you for using e-visits. 5-10 minutes spent reviewing and documenting in chart.   

## 2021-03-29 ENCOUNTER — Telehealth: Payer: Self-pay | Admitting: Physician Assistant

## 2021-03-29 DIAGNOSIS — A6001 Herpesviral infection of penis: Secondary | ICD-10-CM

## 2021-03-29 MED ORDER — VALACYCLOVIR HCL 500 MG PO TABS: 500.0000 mg | ORAL_TABLET | Freq: Two times a day (BID) | ORAL | 1 refills | Status: DC

## 2021-03-29 NOTE — Progress Notes (Signed)
I have spent 5 minutes in review of e-visit questionnaire, review and updating patient chart, medical decision making and response to patient.   Lauralei Clouse Cody Dontrell Stuck, PA-C    

## 2021-03-29 NOTE — Progress Notes (Signed)
E-Visit for Herpes Simplex  We are sorry that you are not feeling well.  Here is how we plan to help!  Based on what you have shared ith me, it looks like you may be having an outbreak/flare-up of genital herpes.    I have prescribed I have prescribed Valacyclovir 500 mg Take one by mouth twice a day for 3 days.    If you have been prescribed long term medications to be taken on a regular basis, it is important to follow the recommendations and take them as ordered.    Outbreaks usually include blisters and open sores in the genital area. Outbreaks that happen after the first time are usually not as severe and do not last as long. Genital Herpes Simplex is a commonly sexually transmitted viral infection that is found worldwide. Most of these genital infections are caused by one or two herpes simplex viruses that is passed from person to person during vaginal, oral, or anal sex. Sometimes, people do not know they have herpes because they do not have any symptoms.  Please be aware that if you have genital herpes you can be contagious even when you are not having rash or flare-up and you may not have any symptoms, even when you are taking suppressive medicines.  Herpes cannot be cured. The disease usually causes most problems during the first few years. After that, the virus is still there, but it causes few to no symptoms. Even when the virus is active, people with herpes can take medicines to reduce and help prevent symptoms.  Herpes is an infection that can cause blisters and open sores on the genital area. Herpes is caused by a virus that is passed from person to person during vaginal, oral, or anal sex. Sometimes, people do not know they have herpes because they do not have any symptoms. Herpes cannot be cured. The disease usually causes most problems during the first few years. After that, the virus is still there, but it causes few to no symptoms. Even when the virus is active, people with herpes  can take medicines to reduce and help prevent symptoms.  If you have been prescribed medications to be taken on a regular basis, it is important to follow the recommendations and take them as ordered.  Some people with herpes never have any symptoms. But other people can develop symptoms within a few weeks of being infected with the herpes virus   Symptoms usually include blisters in the genital area. In women, this area includes the vagina, buttocks, anus, or thighs. In men, this area includes the penis, scrotum, anus, butt, or thighs. The blisters can become painful open sores, which then crust over as they heal. Sometimes, people can have other symptoms that include:  ?Blisters on the mouth or lips ?Fever, headache, or pain in the joints ?Trouble urinating  Outbreaks might occur every month or more often, or just once or twice a year. Sometimes, people can tell when an outbreak will occur, because they feel itching or pain beforehand. Sometimes they do not know that an outbreak is coming because they have no symptoms. Whatever your pattern is, keep in mind that herpes outbreaks usually become less frequent over time as you get older. Certain things, called "triggers," can make outbreaks more likely to occur. These include stress, sunlight, menstrual periods,or getting sick.  Antiviral therapy can shorten the duration of symptoms and signs in primary infection, which, when untreated, can be associated with significant increase in the   symptoms of the disease.  HOME CARE Use a portable bath (such as a "Sitz bath") where you can sit in warm water for about 20 minutes. Your bathtub could also work. Avoid bubble baths.  Keep the genital area clean and dry and avoid tight clothes.  Take over-the-counter pain medicine such as acetaminophen (brand name: Tylenol) or ibuprofen sample brand names: Advil, Motrin). But avoid aspirin.  Only take medications as instructed by your medical team.  You are  most likely to spread herpes to a sex partner when you have blisters and open sores on your body. But it's also possible to spread herpes to your partner when you do not have any symptoms. That is because herpes can be present on your body without causing any symptoms, like blisters or pain.  Telling your sex partner that you have herpes can be hard. But it can help protect them, since there are ways to lower the risk of spreading the infection.   Using a condom every time you have sex  Not having sex when you have symptoms  Not having oral sex if you have blisters or open sores (in the genital area or around your mouth)  MAKE SURE YOU   Understand these instructions. Do not have sex without using a condom until you have been seen by a doctor and as instructed by the provider If you are not better or improved within 7 days, you MUST have a follow up at your doctor or the health department for evaluation. There are other causes of rashes in the genital region.  Thank you for choosing an e-visit.  Your e-visit answers were reviewed by a board certified advanced clinical practitioner to complete your personal care plan. Depending upon the condition, your plan could have included both over the counter or prescription medications.  Please review your pharmacy choice. Make sure the pharmacy is open so you can pick up prescription now. If there is a problem, you may contact your provider through MyChart messaging and have the prescription routed to another pharmacy.  Your safety is important to us. If you have drug allergies check your prescription carefully.   For the next 24 hours you can use MyChart to ask questions about today's visit, request a non-urgent call back, or ask for a work or school excuse. You will get an email in the next two days asking about your experience. I hope that your e-visit has been valuable and will speed your recovery.      

## 2021-04-02 ENCOUNTER — Encounter (HOSPITAL_COMMUNITY): Payer: Self-pay | Admitting: Physician Assistant

## 2021-04-02 ENCOUNTER — Telehealth (INDEPENDENT_AMBULATORY_CARE_PROVIDER_SITE_OTHER): Payer: No Payment, Other | Admitting: Physician Assistant

## 2021-04-02 DIAGNOSIS — F321 Major depressive disorder, single episode, moderate: Secondary | ICD-10-CM

## 2021-04-02 DIAGNOSIS — G479 Sleep disorder, unspecified: Secondary | ICD-10-CM | POA: Diagnosis not present

## 2021-04-02 DIAGNOSIS — F4310 Post-traumatic stress disorder, unspecified: Secondary | ICD-10-CM

## 2021-04-02 DIAGNOSIS — F411 Generalized anxiety disorder: Secondary | ICD-10-CM

## 2021-04-02 MED ORDER — LAMOTRIGINE 25 MG PO TABS: 25.0000 mg | ORAL_TABLET | Freq: Every day | ORAL | 1 refills | Status: DC

## 2021-04-02 MED ORDER — TRAZODONE HCL 50 MG PO TABS: 50.0000 mg | ORAL_TABLET | Freq: Every day | ORAL | 1 refills | Status: DC

## 2021-04-02 MED ORDER — ESCITALOPRAM OXALATE 20 MG PO TABS: 40.0000 mg | ORAL_TABLET | Freq: Every day | ORAL | 1 refills | Status: DC

## 2021-04-02 NOTE — Progress Notes (Signed)
BH MD/PA/NP OP Progress Note  Virtual Visit via Telephone Note  I connected with William Caldwell on 04/02/21 at 11:30 AM EST by telephone and verified that I am speaking with the correct person using two identifiers.  Location: Patient: Home Provider: Clinic   I discussed the limitations, risks, security and privacy concerns of performing an evaluation and management service by telephone and the availability of in person appointments. I also discussed with the patient that there may be a patient responsible charge related to this service. The patient expressed understanding and agreed to proceed.  Follow Up Instructions:   I discussed the assessment and treatment plan with the patient. The patient was provided an opportunity to ask questions and all were answered. The patient agreed with the plan and demonstrated an understanding of the instructions.   The patient was advised to call back or seek an in-person evaluation if the symptoms worsen or if the condition fails to improve as anticipated.  I provided 15 minutes of non-face-to-face time during this encounter.  Meta Hatchet, PA    04/02/2021 11:41 AM William Caldwell  MRN:  [id number]  Chief Complaint: Follow up and medication management  HPI:   William Caldwell is a 26 year old male with a past psychiatric history significant for generalized anxiety disorder, PTSD, major depressive disorder, and sleep disturbances who presents to [facility] via virtual telephone visit for follow-up and medication management.  Patient is currently being managed on the following medications:  Lexapro 20 mg daily Lamotrigine 25 mg daily  Patient reports that he has had some rough patches this year but states that his mental health has been well.  Patient endorses improvements in his depressive symptoms stating that he is eating more and trying to go out to the gym to stay in shape.  He reports that his anxiety  has not gone anywhere and rates his anxiety at 6 out of 10.  Although he feels happier, he states that he feels his anxiety is going to be with him for the rest of his life.  Patient denies any new stressors at this time stating that he is just trying to be the best version of himself.  A PHQ-9 screen was performed with the patient scoring a 21.  A GAD-7 screen was also performed with the patient scoring a 19.  Patient is alert and oriented x4, calm, cooperative, and fully engaged in conversation during the encounter.  Endorses good mood.  Patient denies suicidal or homicidal ideations.  He further denies auditory or visual hallucinations and does not appear to be responding to internal/external stimuli.  The patient endorses fair sleep and receives on average 6 hours of sleep each night.  Patient endorses good appetite and eats on average 2 meals per day.  Patient endorses occasional alcohol consumption.  Patient endorses tobacco use and smokes on average 7 to 8 cigarettes/day.  When he is at work, patient will smoke roughly 2 to 3 cigarettes/day.  Patient endorses illicit drug use in the form of marijuana.  Visit Diagnosis:    ICD-10-CM   1. Generalized anxiety disorder  F41.1 escitalopram (LEXAPRO) 20 MG tablet    2. Posttraumatic stress disorder  F43.10 escitalopram (LEXAPRO) 20 MG tablet    3. Major depressive disorder, single episode, moderate (HCC)  F32.1 escitalopram (LEXAPRO) 20 MG tablet    lamoTRIgine (LAMICTAL) 25 MG tablet    4. Sleep disturbances  G47.9 traZODone (DESYREL) 50 MG tablet  Past Psychiatric History:  Generalized anxiety disorder PTSD Major depressive disorder Sleep disturbances  Past Medical History:  Past Medical History:  Diagnosis Date   Anxiety    Herpes    History reviewed. No pertinent surgical history.  Family Psychiatric History:  Patient is unsure if there is a family history of psychiatric illness, however, he sees some of the same patterns that  he is dealing with in his siblings.  Family History: History reviewed. No pertinent family history.  Social History:  Social History   Socioeconomic History   Marital status: Single    Spouse name: Not on file   Number of children: Not on file   Years of education: Not on file   Highest education level: Not on file  Occupational History   Not on file  Tobacco Use   Smoking status: Every Day    Packs/day: 0.25    Types: Cigarettes   Smokeless tobacco: Never  Vaping Use   Vaping Use: Never used  Substance and Sexual Activity   Alcohol use: Not Currently    Comment: occassional   Drug use: Not Currently   Sexual activity: Yes    Birth control/protection: Condom  Other Topics Concern   Not on file  Social History Narrative   Not on file   Social Determinants of Health   Financial Resource Strain: Not on file  Food Insecurity: Not on file  Transportation Needs: Not on file  Physical Activity: Not on file  Stress: Not on file  Social Connections: Not on file    Allergies: No Known Allergies  Metabolic Disorder Labs: No results found for: HGBA1C, MPG No results found for: PROLACTIN No results found for: CHOL, TRIG, HDL, CHOLHDL, VLDL, LDLCALC No results found for: TSH  Therapeutic Level Labs: No results found for: LITHIUM No results found for: VALPROATE No components found for:  CBMZ  Current Medications: Current Outpatient Medications  Medication Sig Dispense Refill   albuterol (VENTOLIN HFA) 108 (90 Base) MCG/ACT inhaler Inhale 1-2 puffs into the lungs every 6 (six) hours as needed for wheezing or shortness of breath. 8 g 0   busPIRone (BUSPAR) 7.5 MG tablet Take 1 tablet (7.5 mg total) by mouth 2 (two) times daily. 60 tablet 1   escitalopram (LEXAPRO) 20 MG tablet Take 2 tablets (40 mg total) by mouth daily. 60 tablet 1   hydrOXYzine (ATARAX/VISTARIL) 25 MG tablet Take 25 mg by mouth 3 (three) times daily as needed for anxiety.     lamoTRIgine (LAMICTAL) 25  MG tablet Take 1 tablet (25 mg total) by mouth daily. 30 tablet 1   neomycin-polymyxin-hydrocortisone (CORTISPORIN) OTIC solution Place 3 drops into the right ear 4 (four) times daily. 10 mL 0   omeprazole (PRILOSEC) 20 MG capsule Take 1 capsule (20 mg total) by mouth daily. 30 capsule 3   QUEtiapine (SEROQUEL) 50 MG tablet Take 1 tablet (50 mg total) by mouth at bedtime. 30 tablet 1   traZODone (DESYREL) 50 MG tablet Take 1 tablet (50 mg total) by mouth at bedtime. Patient to take an additional tablet if sleep disturbances continue 30 tablet 1   triamcinolone cream (KENALOG) 0.1 % Apply 1 application topically 2 (two) times daily. 30 g 0   valACYclovir (VALTREX) 500 MG tablet Take 1 tablet (500 mg total) by mouth 2 (two) times daily. 6 tablet 1   No current facility-administered medications for this visit.     Musculoskeletal: Strength & Muscle Tone: Unable to assess due to telemedicine  visit Gait & Station: Unable to assess due to telemedicine visit Patient leans: Unable to assess due to telemedicine visit  Psychiatric Specialty Exam: Review of Systems  Psychiatric/Behavioral:  Positive for sleep disturbance. Negative for decreased concentration, dysphoric mood, hallucinations, self-injury and suicidal ideas. The patient is nervous/anxious. The patient is not hyperactive.    There were no vitals taken for this visit.There is no height or weight on file to calculate BMI.  General Appearance: Unable to assess due to telemedicine visit  Eye Contact:  Unable to assess due to telemedicine visit  Speech:  Clear and Coherent and Normal Rate  Volume:  Normal  Mood:  Anxious and Euthymic  Affect:  Appropriate and Congruent  Thought Process:  Coherent, Goal Directed, and Descriptions of Associations: Intact  Orientation:  Full (Time, Place, and Person)  Thought Content: WDL   Suicidal Thoughts:  No  Homicidal Thoughts:  No  Memory:  Immediate;   Good Recent;   Good Remote;   Good   Judgement:  Good  Insight:  Fair  Psychomotor Activity:  Normal  Concentration:  Concentration: Good and Attention Span: Good  Recall:  Good  Fund of Knowledge: Good  Language: Good  Akathisia:  No  Handed:  Left  AIMS (if indicated): not done  Assets:  Communication Skills Desire for Improvement Housing Social Support Vocational/Educational  ADL's:  Intact  Cognition: WNL  Sleep:  Fair   Screenings: GAD-7    Flowsheet Row Video Visit from 04/02/2021 in [facility] Video Visit from 02/03/2021 in [facility] Video Visit from 12/07/2020 in [facility] Video Visit from 10/27/2020 in [facility] Video Visit from 09/09/2020 in [facility]  Total GAD-7 Score 19 21 21 13 21       PHQ2-9    [place] Video Visit from 04/02/2021 in [facility] Video Visit from 02/03/2021 in [facility] Video Visit from 12/07/2020 in [facility] Video Visit from 10/27/2020 in [facility] Video Visit from 09/09/2020 in [place]  PHQ-2 Total Score 5 4 4 2 5   PHQ-9 Total Score 21 20 19 18 23       Flowsheet Row Video Visit from 04/02/2021 in [facility] Video Visit from 02/03/2021 in [facility] Video Visit from 12/07/2020 in [place] and Plan:   Tilmon Bosarge is a 26 year old male with a past psychiatric history significant for generalized anxiety disorder, PTSD, major depressive disorder, and sleep disturbances who presents to [facility] via virtual telephone visit for follow-up and medication management.  Patient reports that  his depressive symptoms have been more manageable lately but still endorses anxiety.  Provider recommended increasing patient's Lexapro from 20 mg to 40 mg daily for the management of his depressive episodes and anxiety.  Patient was agreeable to recommendation.  Patient's medications to be e- prescribed to pharmacy of choice.  1. Generalized anxiety disorder  - escitalopram (LEXAPRO) 20 MG tablet; Take 2 tablets (40 mg total) by mouth daily.  Dispense: 60 tablet; Refill: 1  2. Posttraumatic stress disorder  - escitalopram (LEXAPRO) 20 MG tablet; Take 2 tablets (40 mg total) by mouth daily.  Dispense: 60 tablet; Refill: 1  3. Major depressive disorder, single episode, moderate (HCC)  - escitalopram (LEXAPRO) 20 MG tablet; Take 2 tablets (40 mg total) by mouth daily.  Dispense: 60 tablet; Refill: 1 - lamoTRIgine (LAMICTAL) 25 MG tablet; Take 1 tablet (25 mg total) by mouth daily.  Dispense: 30 tablet; Refill: 1  4. Sleep disturbances  - traZODone (DESYREL) 50 MG tablet; Take 1 tablet (50 mg total) by mouth at bedtime. Patient to take an additional tablet if sleep disturbances continue  Dispense: 30 tablet; Refill: 1  Patient to follow up in 2 months Provider spent a total of 15 minutes with the patient/reviewing patient's chart  Malachy Mood, PA 04/02/2021, 9:51 PM

## 2021-05-26 ENCOUNTER — Telehealth: Payer: Self-pay | Admitting: Physician Assistant

## 2021-05-26 DIAGNOSIS — A6001 Herpesviral infection of penis: Secondary | ICD-10-CM

## 2021-05-26 MED ORDER — VALACYCLOVIR HCL 500 MG PO TABS: 500.0000 mg | ORAL_TABLET | Freq: Two times a day (BID) | ORAL | 1 refills | Status: DC

## 2021-05-26 NOTE — Progress Notes (Signed)
I have spent 5 minutes in review of e-visit questionnaire, review and updating patient chart, medical decision making and response to patient.   Anylah Scheib Cody Harlyn Italiano, PA-C    

## 2021-05-26 NOTE — Progress Notes (Signed)
E-Visit for Herpes Simplex ? ?We are sorry that you are not feeling well.  Here is how we plan to help! ? ?Based on what you have shared ith me, it looks like you may be having an outbreak/flare-up of genital herpes.   You need to reach out to a PCP or be seen in person so that you can get restarted on a preventive dose of medication since you are having frequent outbreaks. For now I will treat the acute outbreak.  ? ?I have prescribed I have prescribed Valacyclovir 500 mg Take one by mouth twice a day for 3 days.   ? ?If you have been prescribed long term medications to be taken on a regular basis, it is important to follow the recommendations and take them as ordered.   ? ?Outbreaks usually include blisters and open sores in the genital area. Outbreaks that happen after the first time are usually not as severe and do not last as long. Genital Herpes Simplex is a commonly sexually transmitted viral infection that is found worldwide. Most of these genital infections are caused by one or two herpes simplex viruses that is passed from person to person during vaginal, oral, or anal sex. Sometimes, people do not know they have herpes because they do not have any symptoms.  Please be aware that if you have genital herpes you can be contagious even when you are not having rash or flare-up and you may not have any symptoms, even when you are taking suppressive medicines.  Herpes cannot be cured. The disease usually causes most problems during the first few years. After that, the virus is still there, but it causes few to no symptoms. Even when the virus is active, people with herpes can take medicines to reduce and help prevent symptoms. ? ?Herpes is an infection that can cause blisters and open sores on the genital area. Herpes is caused by a virus that is passed from person to person during vaginal, oral, or anal sex. Sometimes, people do not know they have herpes because they do not have any symptoms. ?Herpes cannot be  cured. The disease usually causes most problems during the first few years. After that, the virus is still there, but it causes few to no symptoms. Even when the virus is active, people with herpes can take medicines to reduce and help prevent symptoms. ? ?If you have been prescribed medications to be taken on a regular basis, it is important to follow the recommendations and take them as ordered.  Some people with herpes never have any symptoms. But other people can develop symptoms within a few weeks of being infected with the herpes virus ? ? ?Symptoms usually include blisters in the genital area. In women, this area includes the vagina, buttocks, anus, or thighs. In men, this area includes the penis, scrotum, anus, butt, or thighs. The blisters can become painful open sores, which then crust over as they heal. ?Sometimes, people can have other symptoms that include: ? ??Blisters on the mouth or lips ??Fever, headache, or pain in the joints ??Trouble urinating ? ?Outbreaks might occur every month or more often, or just once or twice a year. Sometimes, people can tell when an outbreak will occur, because they feel itching or pain beforehand. Sometimes they do not know that an outbreak is coming because they have no symptoms. Whatever your pattern is, keep in mind that herpes outbreaks usually become less frequent over time as you get older. ?Certain things, called "triggers,"  can make outbreaks more likely to occur. These include stress, sunlight, menstrual periods,or getting sick.  Antiviral therapy can shorten the duration of symptoms and signs in primary infection, which, when untreated, can be associated with significant increase in the symptoms of the disease. ? ?HOME CARE ?Use a portable bath (such as a "Sitz bath") where you can sit in warm water for about 20 minutes. Your bathtub could also work. Avoid bubble baths. ? ?Keep the genital area clean and dry and avoid tight clothes. ? ?Take over-the-counter  pain medicine such as acetaminophen (brand name: Tylenol) or ibuprofen sample brand names: Advil, Motrin). But avoid aspirin. ? ?Only take medications as instructed by your medical team. ? ?You are most likely to spread herpes to a sex partner when you have blisters and open sores on your body. But it's also possible to spread herpes to your partner when you do not have any symptoms. That is because herpes can be present on your body without causing any symptoms, like blisters or pain. ? ?Telling your sex partner that you have herpes can be hard. But it can help protect them, since there are ways to lower the risk of spreading the infection.  ? ?Using a condom every time you have sex ? ?Not having sex when you have symptoms ? ?Not having oral sex if you have blisters or open sores (in the genital area or around your mouth) ? ?MAKE SURE YOU  ? ?Understand these instructions. ?Do not have sex without using a condom until you have been seen by a doctor and as instructed by the provider ?If you are not better or improved within 7 days, you MUST have a follow up at your doctor or the health department for evaluation. There are other causes of rashes in the genital region. ? ?Thank you for choosing an e-visit. ? ?Your e-visit answers were reviewed by a board certified advanced clinical practitioner to complete your personal care plan. Depending upon the condition, your plan could have included both over the counter or prescription medications. ? ?Please review your pharmacy choice. Make sure the pharmacy is open so you can pick up prescription now. If there is a problem, you may contact your provider through [employer] and have the prescription routed to another pharmacy.  Your safety is important to [place]. If you have drug allergies check your prescription carefully.  ? ?For the next 24 hours you can use MyChart to ask questions about today's visit, request a non-urgent call back, or ask for a work or school  excuse. ?You will get an email in the next two days asking about your experience. I hope that your e-visit has been valuable and will speed your recovery. ? ? ? ? ?

## 2021-06-02 ENCOUNTER — Encounter (HOSPITAL_COMMUNITY): Payer: Self-pay

## 2021-06-02 ENCOUNTER — Telehealth (HOSPITAL_COMMUNITY): Payer: No Payment, Other | Admitting: Physician Assistant

## 2021-08-02 ENCOUNTER — Other Ambulatory Visit: Payer: Self-pay

## 2021-08-02 ENCOUNTER — Encounter (HOSPITAL_COMMUNITY): Payer: Self-pay | Admitting: Emergency Medicine

## 2021-08-02 ENCOUNTER — Emergency Department (HOSPITAL_COMMUNITY)
Admission: EM | Admit: 2021-08-02 | Discharge: 2021-08-02 | Disposition: A | Discharge status: Home / Self Care | Payer: Self-pay | Attending: Emergency Medicine | Admitting: Emergency Medicine

## 2021-08-02 DIAGNOSIS — I10 Essential (primary) hypertension: Secondary | ICD-10-CM | POA: Insufficient documentation

## 2021-08-02 DIAGNOSIS — Z79899 Other long term (current) drug therapy: Secondary | ICD-10-CM | POA: Insufficient documentation

## 2021-08-02 DIAGNOSIS — M791 Myalgia, unspecified site: Secondary | ICD-10-CM | POA: Insufficient documentation

## 2021-08-02 DIAGNOSIS — R52 Pain, unspecified: Secondary | ICD-10-CM

## 2021-08-02 LAB — RAPID URINE DRUG SCREEN, HOSP PERFORMED
Amphetamines: NOT DETECTED
Barbiturates: NOT DETECTED
Benzodiazepines: NOT DETECTED
Cocaine: NOT DETECTED
Opiates: NOT DETECTED
Tetrahydrocannabinol: POSITIVE — AB

## 2021-08-02 NOTE — Discharge Instructions (Signed)
If you develop new, or concerning changes do not hesitate to return here. ?

## 2021-08-02 NOTE — ED Triage Notes (Signed)
BIBA ?Per EMS: Pt c/o body aches & headaches. Hx substance abuse disorder. Uses THC, ectasy, ETOH. Hasn't used in 3 weeks per pt.  ?120/80 ?80HR ?18 RR  ?99% RA ?88CBG  ? ?

## 2021-08-02 NOTE — ED Provider Notes (Signed)
?[facility]-EMERGENCY DEPT ?Provider Note ? ? ?CSN: [id number] ?Arrival date & time: 08/02/21  1131 ? ?  ? ?History ? ?Chief Complaint  ?Patient presents with  ? Generalized Body Aches  ? ? ?William Caldwell is a 26 y.o. male. ? ?HPI ?Patient presents from occupational health with staff concerns of patient's description of pain.  Patient is forthright with a history of alcohol abuse, substance abuse, last drink 2 weeks ago, last used MDMA orally within the past days, smokes marijuana frequently.  Today he went to occupational health for drug screen for new employment.  There, staff apparently thought the patient was complaining of more pain than the expected, and he was sent here for evaluation.  Patient describes a tightness sensation around the mid lower thorax and abdomen, but no vomiting, no diarrhea, no fever, no dyspnea. ?  ? ?Home Medications ?Prior to Admission medications   ?Medication Sig Start Date End Date Taking? Authorizing Provider  ?albuterol (VENTOLIN HFA) 108 (90 Base) MCG/ACT inhaler Inhale 1-2 puffs into the lungs every 6 (six) hours as needed for wheezing or shortness of breath. 03/21/20   Henderly, Britni A, PA-C  ?busPIRone (BUSPAR) 7.5 MG tablet Take 1 tablet (7.5 mg total) by mouth 2 (two) times daily. 09/09/20 09/09/21  Meta Hatchet, PA  ?escitalopram (LEXAPRO) 20 MG tablet Take 2 tablets (40 mg total) by mouth daily. 04/02/21 04/02/22  Meta Hatchet, PA  ?hydrOXYzine (ATARAX/VISTARIL) 25 MG tablet Take 25 mg by mouth 3 (three) times daily as needed for anxiety. 07/19/19   [provider]  ?lamoTRIgine (LAMICTAL) 25 MG tablet Take 1 tablet (25 mg total) by mouth daily. 04/02/21 04/02/22  Meta Hatchet, PA  ?neomycin-polymyxin-hydrocortisone (CORTISPORIN) OTIC solution Place 3 drops into the right ear 4 (four) times daily. 11/13/20   Margaretann Loveless, PA-C  ?omeprazole (PRILOSEC) 20 MG capsule Take 1 capsule (20 mg total) by mouth daily. 02/08/21   Bennie Pierini, FNP  ?QUEtiapine (SEROQUEL) 50 MG tablet Take 1 tablet (50 mg total) by mouth at bedtime. 12/07/20   Nwoko, Tommas Olp, PA  ?traZODone (DESYREL) 50 MG tablet Take 1 tablet (50 mg total) by mouth at bedtime. Patient to take an additional tablet if sleep disturbances continue 04/02/21   Karel Jarvis E, PA  ?triamcinolone cream (KENALOG) 0.1 % Apply 1 application topically 2 (two) times daily. 01/18/21   Couture, Cortni S, PA-C  ?valACYclovir (VALTREX) 500 MG tablet Take 1 tablet (500 mg total) by mouth 2 (two) times daily. 05/26/21   Waldon Merl, PA-C  ?   ? ?Allergies    ?Patient has no known allergies.   ? ?Review of Systems   ?Review of Systems  ?All other systems reviewed and are negative. ? ?Physical Exam ?Updated Vital Signs ?BP (!) 124/101   Pulse 81   Temp 98.2 ?F (36.8 ?C) (Oral)   Resp 15   SpO2 100%  ?Physical Exam ?Vitals and nursing note reviewed.  ?Constitutional:   ?   General: He is not in acute distress. ?   Appearance: He is well-developed.  ?HENT:  ?   Head: Normocephalic and atraumatic.  ?Eyes:  ?   Conjunctiva/sclera: Conjunctivae normal.  ?Cardiovascular:  ?   Rate and Rhythm: Normal rate and regular rhythm.  ?Pulmonary:  ?   Effort: Pulmonary effort is normal. No respiratory distress.  ?   Breath sounds: No stridor.  ?Abdominal:  ?   General: There is no distension.  ?Skin: ?  General: Skin is warm and dry.  ?Neurological:  ?   Mental Status: He is alert and oriented to person, place, and time.  ? ? ?ED Results / Procedures / Treatments   ?Labs ?(all labs ordered are listed, but only abnormal results are displayed) ?Labs Reviewed  ?RAPID URINE DRUG SCREEN, HOSP PERFORMED  ?COMPREHENSIVE METABOLIC PANEL  ?CBC WITH DIFFERENTIAL/PLATELET  ?CK  ? ? ?EKG ?None ? ?Radiology ?No results found. ? ?Procedures ?Procedures  ? ? ?Medications Ordered in ED ?Medications - No data to display ? ?ED Course/ Medical Decision Making/ A&P ?This patient with a Hx of polysubstance abuse  presents to the ED for concern of abdominal lower chest tightness, mild hypertension, this involves an extensive number of treatment options, and is a complaint that carries with it a high risk of complications and morbidity.   ? ?The differential diagnosis includes hypertensive urgency, withdrawal, dehydration, electrolyte abnormalities, rhabdomyolysis ? ? ?Social Determinants of Health: ? ?Polysubstance abuse ? ?Additional history obtained: ? ?Additional history and/or information obtained from paper referral from occupational health with concerns as above ? ?After the initial evaluation, orders, including: Labs were initiated. ? ? ?Patient placed on Cardiac and Pulse-Oximetry Monitors. ?The patient was maintained on a cardiac monitor.  The cardiac monitored showed an rhythm of 80 sinus normal ?The patient was also maintained on pulse oximetry. The readings were typically 100% room air normal ? ? ? ? ?Dispostion / Final MDM: ? ?After consideration of the diagnostic results and the patient's response to treatment, patient elected to leave prior to lab draw.  He is here voluntarily, essentially denies complaints other than some tightness, does not want to stay for evaluation, was counseled that he is leaving prior to complete evaluation, but with no hemodynamic instability, some suspicion for his recent MDMA use contributing to his presentation, no indication for IVC or to hold him against as well, patient discharged, per request. ? ?Final Clinical Impression(s) / ED Diagnoses ?Final diagnoses:  ?Pain  ? ?  ?Gerhard Munch, MD ?08/02/21 1238 ? ?

## 2022-03-09 IMAGING — DX DG CHEST 1V PORT
2 series · 2 of 2 positions shown · non-contrast
Comparison: None.

CLINICAL DATA: Cough, shortness of breath.

EXAM:
PORTABLE CHEST 1 VIEW

[chest ap (1 of 2)]
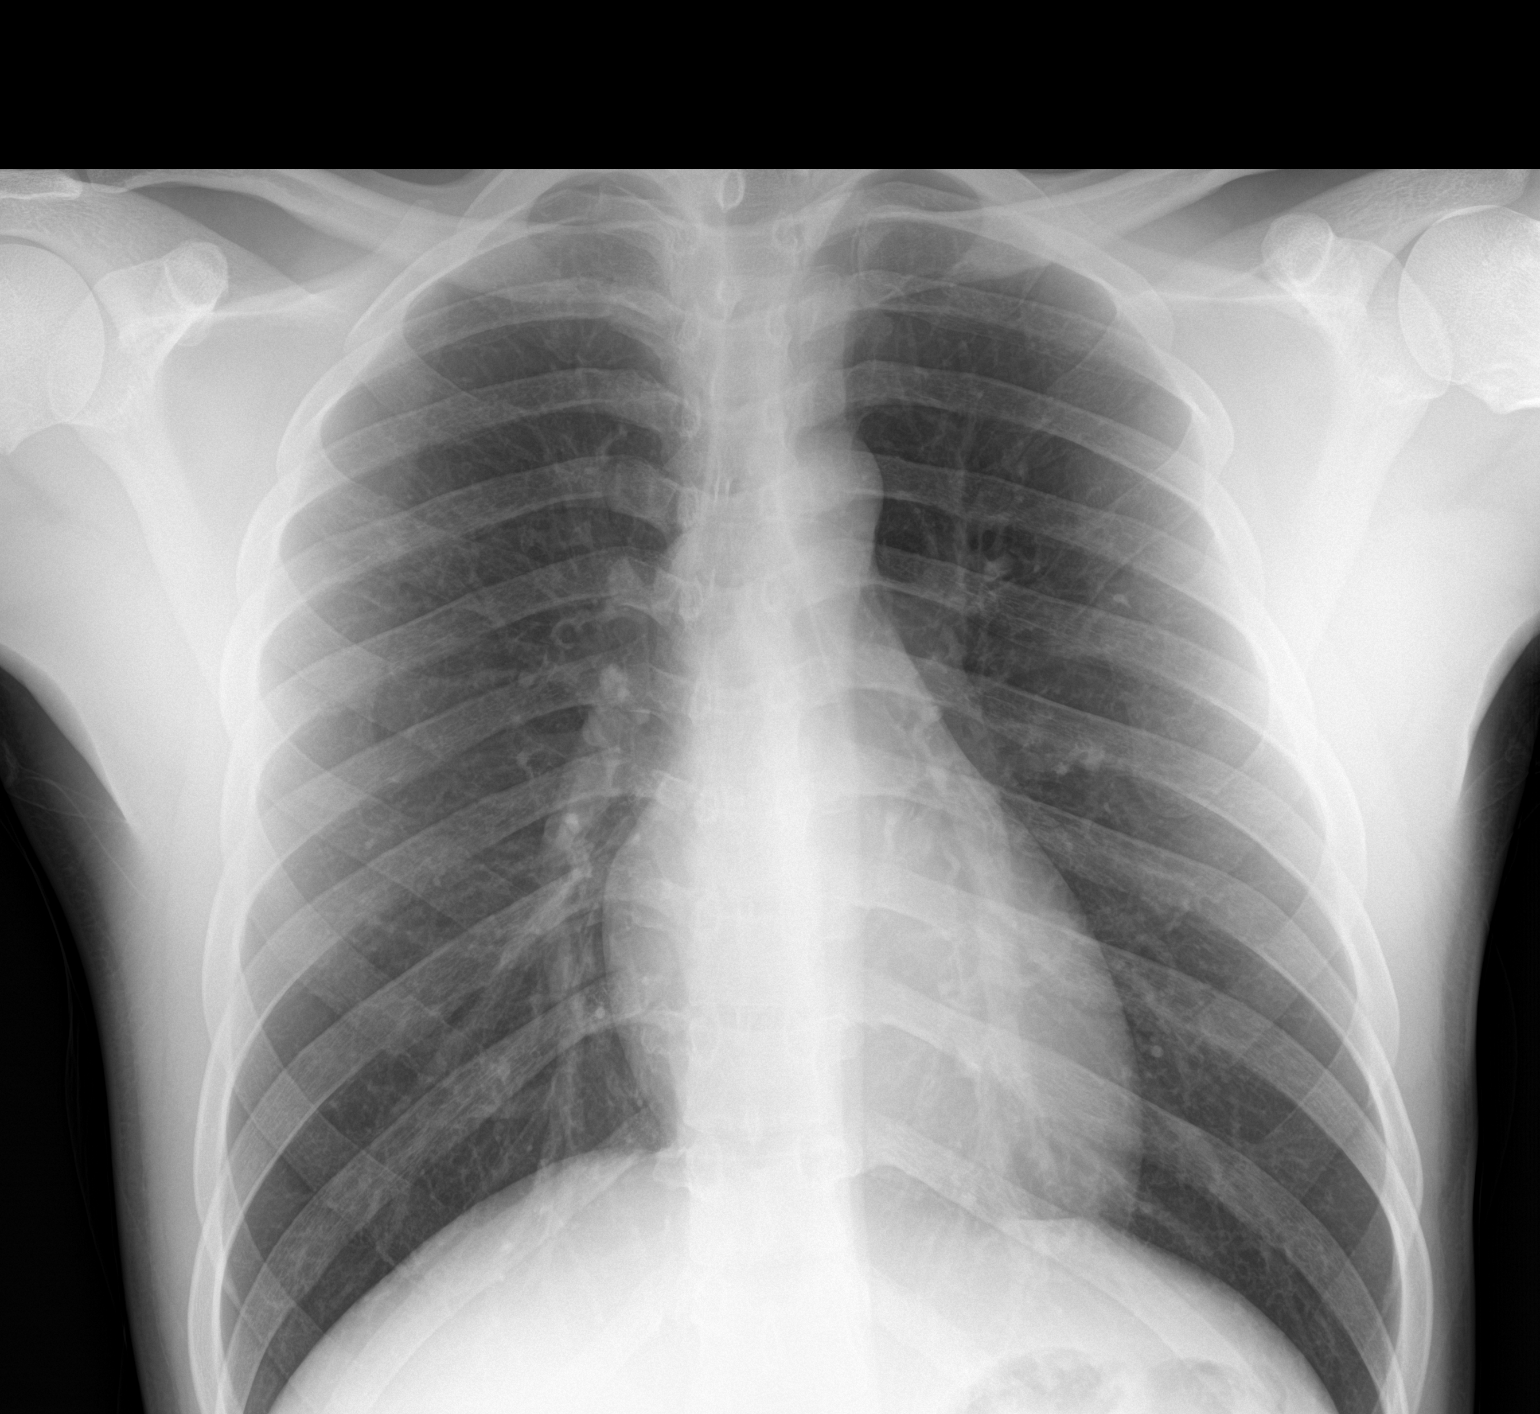

[chest ap (2 of 2)]
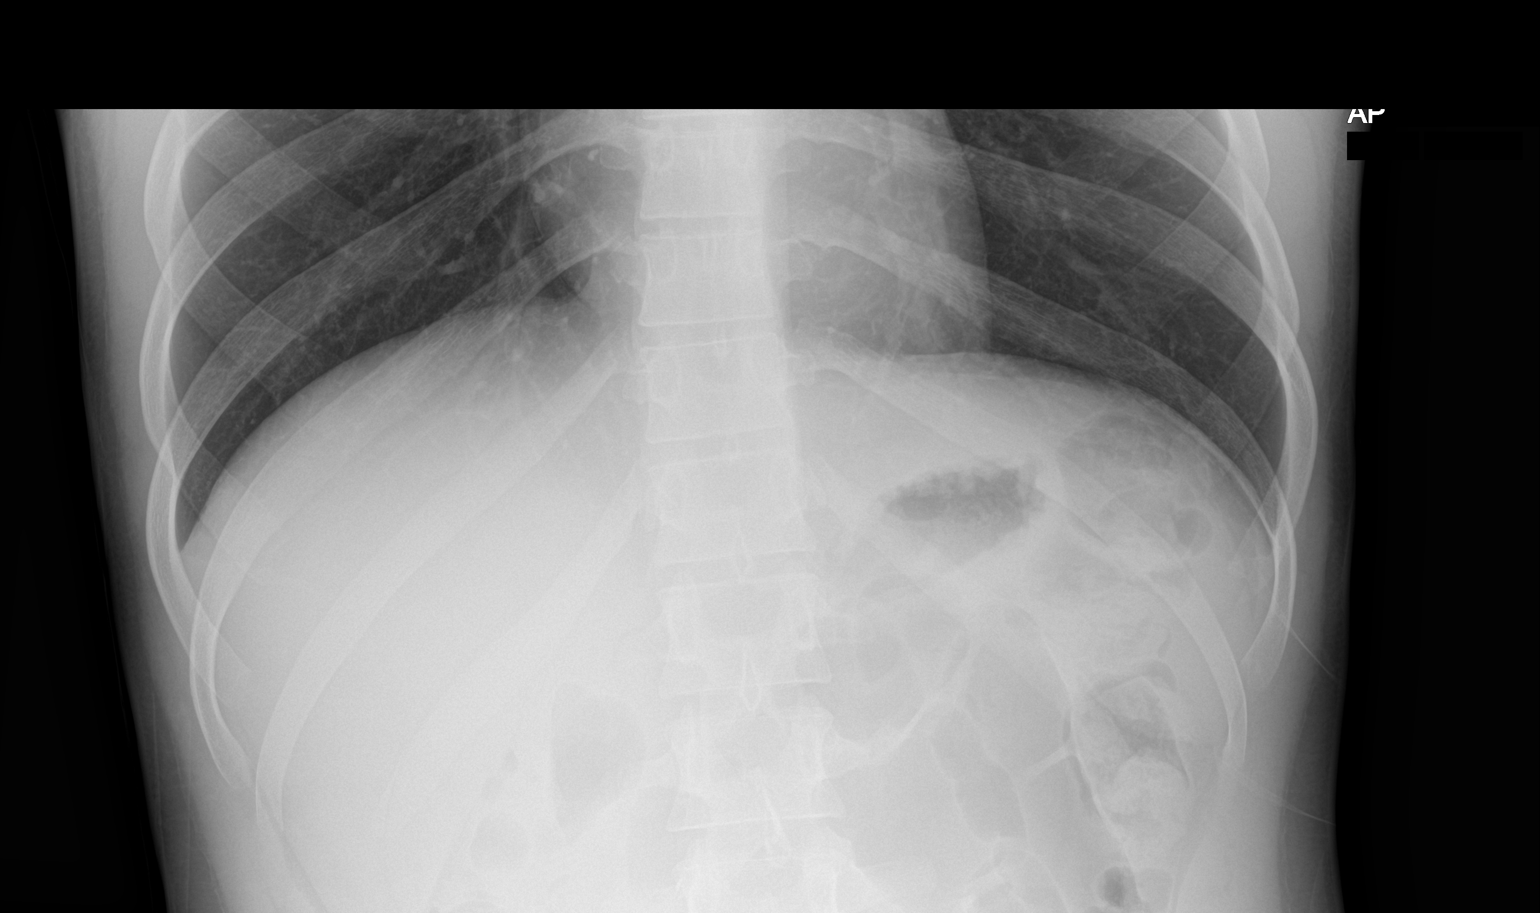

[2 of 2 positions shown; findings below may reference images not displayed]

FINDINGS: The heart size and mediastinal contours are within normal limits.
Both lungs are clear. No pneumothorax or pleural effusion is noted.
The visualized skeletal structures are unremarkable.
IMPRESSION: No active disease.

## 2022-06-06 ENCOUNTER — Telehealth: Payer: Self-pay | Admitting: Physician Assistant

## 2022-06-06 DIAGNOSIS — B86 Scabies: Secondary | ICD-10-CM

## 2022-06-06 MED ORDER — PERMETHRIN 5 % EX CREA: TOPICAL_CREAM | Freq: Once | CUTANEOUS | Status: DC

## 2022-06-06 NOTE — Progress Notes (Signed)
E-Visit for Scabies  We are sorry that you are not feeling well. Here is how we plan to help!  Based on what you shared with me it looks like you have scabies.  Scabies is an infection caused by very tiny mites that burrow into the skin.  They cause severe itching. Though children are most commonly infected, anyone can get scabies.  Scabies mites can pass from person to person through physical close contact.  They can also be passed through shared clothing, towels, and bedding.  Scabies infection is not usually dangerous, but it is uncomfortable.  Because it is so contagious, scabies should be treated immediately to keep the infection from spreading.  If you have children in day care, please have any exposed children examined by their pediatrician. .   I have prescribed Permethrin topical cream 5% thoroughly massage cream from head to soles of feet; leave on for 8 to 14 hours before removing (shower or bath). May repeat if living mites are observed 14 days after first treatment; one application is generally curative.    HOME CARE:  You should treat all members in your household whether they show symptoms or not. Wash towels, clothes, bed linens, cloth toys, hats, and other personal items in hot water and dry on high heat. Vacuum floors and furniture and throw away the bag afterwards. Keep your child home from day care or school until the morning after treatment for scabies. Notify your child's day care or school so that other children can be checked and treated.  GET HELP RIGHT AWAY IF:  The infected person has a fever, red streaks, pain, or swelling of the skin. Sores get worse or don't heal. New rashes appear or itching continues for more than 2 weeks after treatment.  MAKE SURE YOU:  Understand these instructions. Will watch your condition. Will get help right away if you are not doing well or get worse.  Thank you for choosing an e-visit.  Your e-visit answers were reviewed by a  board certified advanced clinical practitioner to complete your personal care plan. Depending upon the condition, your plan could have included both over the counter or prescription medications.  Please review your pharmacy choice. Make sure the pharmacy is open so you can pick up prescription now. If there is a problem, you may contact your provider through [employer] and have the prescription routed to another pharmacy.  Your safety is important to [place]. If you have drug allergies check your prescription carefully.   For the next 24 hours you can use MyChart to ask questions about today's visit, request a non-urgent call back, or ask for a work or school excuse. You will get an email in the next two days asking about your experience. I hope that your e-visit has been valuable and will speed your recovery.   I have spent 5 minutes in review of e-visit questionnaire, review and updating patient chart, medical decision making and response to patient.   Lenise Arena Ward, PA-C

## 2022-11-20 ENCOUNTER — Telehealth: Payer: Self-pay | Admitting: Family Medicine

## 2022-11-20 DIAGNOSIS — N390 Urinary tract infection, site not specified: Secondary | ICD-10-CM

## 2022-11-20 MED ORDER — CEPHALEXIN 500 MG PO CAPS: 500.0000 mg | ORAL_CAPSULE | Freq: Two times a day (BID) | ORAL | 0 refills | Status: AC

## 2022-11-20 NOTE — Progress Notes (Signed)
E-Visit for Urinary Problems  We are sorry that you are not feeling well.  Here is how we plan to help!  Based on what you shared with me it looks like you most likely have a simple urinary tract infection.  A UTI (Urinary Tract Infection) is a bacterial infection of the bladder.  Most cases of urinary tract infections are simple to treat but a key part of your care is to encourage you to drink plenty of fluids and watch your symptoms carefully.  I have prescribed Keflex 500 mg twice a day for 7 days.  Your symptoms should gradually improve. Call us if the burning in your urine worsens, you develop worsening fever, back pain or pelvic pain or if your symptoms do not resolve after completing the antibiotic.  Urinary tract infections can be prevented by drinking plenty of water to keep your body hydrated.  Also be sure when you wipe, wipe from front to back and don't hold it in!  If possible, empty your bladder every 4 hours.  HOME CARE Drink plenty of fluids Compete the full course of the antibiotics even if the symptoms resolve Remember, when you need to go.go. Holding in your urine can increase the likelihood of getting a UTI! GET HELP RIGHT AWAY IF: You cannot urinate You get a high fever Worsening back pain occurs You see blood in your urine You feel sick to your stomach or throw up You feel like you are going to pass out  MAKE SURE YOU  Understand these instructions. Will watch your condition. Will get help right away if you are not doing well or get worse.   Thank you for choosing an e-visit.  Your e-visit answers were reviewed by a board certified advanced clinical practitioner to complete your personal care plan. Depending upon the condition, your plan could have included both over the counter or prescription medications.  Please review your pharmacy choice. Make sure the pharmacy is open so you can pick up prescription now. If there is a problem, you may contact your  provider through MyChart messaging and have the prescription routed to another pharmacy.  Your safety is important to us. If you have drug allergies check your prescription carefully.   For the next 24 hours you can use MyChart to ask questions about today's visit, request a non-urgent call back, or ask for a work or school excuse. You will get an email in the next two days asking about your experience. I hope that your e-visit has been valuable and will speed your recovery.    have provided 5 minutes of non face to face time during this encounter for chart review and documentation.   

## 2022-11-21 ENCOUNTER — Ambulatory Visit (HOSPITAL_COMMUNITY)
Admission: EM | Admit: 2022-11-21 | Discharge: 2022-11-21 | Disposition: A | Discharge status: Home / Self Care | Payer: Self-pay | Attending: Urgent Care | Admitting: Urgent Care

## 2022-11-21 ENCOUNTER — Encounter (HOSPITAL_COMMUNITY): Payer: Self-pay

## 2022-11-21 DIAGNOSIS — B009 Herpesviral infection, unspecified: Secondary | ICD-10-CM | POA: Insufficient documentation

## 2022-11-21 DIAGNOSIS — Z202 Contact with and (suspected) exposure to infections with a predominantly sexual mode of transmission: Secondary | ICD-10-CM | POA: Insufficient documentation

## 2022-11-21 DIAGNOSIS — B86 Scabies: Secondary | ICD-10-CM | POA: Insufficient documentation

## 2022-11-21 MED ORDER — DOXYCYCLINE HYCLATE 100 MG PO CAPS: 100.0000 mg | ORAL_CAPSULE | Freq: Two times a day (BID) | ORAL | 0 refills | Status: AC

## 2022-11-21 MED ORDER — VALACYCLOVIR HCL 1 G PO TABS: 1000.0000 mg | ORAL_TABLET | Freq: Every day | ORAL | 0 refills | Status: AC

## 2022-11-21 MED ORDER — PERMETHRIN 5 % EX CREA: TOPICAL_CREAM | CUTANEOUS | 0 refills | Status: DC
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
## 2022-11-21 NOTE — Discharge Instructions (Signed)
Follow up here or with your primary care provider if your symptoms are worsening or not improving with treatment.     

## 2022-11-24 LAB — CYTOLOGY, (ORAL, ANAL, URETHRAL) ANCILLARY ONLY
Chlamydia: NEGATIVE
Comment: NEGATIVE
Comment: NEGATIVE
Comment: NORMAL
Neisseria Gonorrhea: NEGATIVE
Trichomonas: NEGATIVE

## 2023-03-19 ENCOUNTER — Telehealth: Payer: Self-pay | Admitting: Family Medicine

## 2023-03-19 DIAGNOSIS — F10939 Alcohol use, unspecified with withdrawal, unspecified: Secondary | ICD-10-CM

## 2023-03-19 NOTE — Progress Notes (Signed)
Based on what you shared with me, I feel your condition warrants further evaluation as soon as possible at an Emergency department.    NOTE: There will be NO CHARGE for this eVisit   If you are having a true medical emergency please call 911.      Emergency Department-[facility]  Get Driving Directions  [phone]  [address]  [place], [place] [address]  Open 24/7/365      [place] [facility] [address]  Get Driving Directions  [address]  [place], [place] [address]  Open 24/7/365    Emergency Department- [facility]  Get Driving Directions  [phone]  [address]  [place], [place] [address]  Open 24/7/365      [facility]  Get Driving Directions  [phone]  [address]  [place], [place] [address]  Open 24/7/365    [place]  Emergency Department- [facility]  Get Driving Directions  [phone]  [address]  [place], [place] [address]  Open 24/7/365    [place]  Emergency Department- [facility] [place]  Get Driving Directions  [address]  [place], [place] [address]  Open 24/7/365    [place]  Emergency Department- [facility]  Get Driving Directions  [phone]  [address]  [place], [place] [address]  Open 24/7/365    

## 2023-03-29 ENCOUNTER — Other Ambulatory Visit: Payer: Self-pay

## 2023-03-29 ENCOUNTER — Emergency Department (HOSPITAL_COMMUNITY): Payer: Medicaid Other

## 2023-03-29 ENCOUNTER — Encounter (HOSPITAL_COMMUNITY): Payer: Self-pay

## 2023-03-29 ENCOUNTER — Emergency Department (HOSPITAL_COMMUNITY): Payer: Medicaid Other | Admitting: Certified Registered Nurse Anesthetist

## 2023-03-29 ENCOUNTER — Inpatient Hospital Stay (HOSPITAL_COMMUNITY)
Admission: EM | Admit: 2023-03-29 | Discharge: 2023-03-31 | DRG: 579 | Disposition: A | Discharge status: Home / Self Care | Payer: Medicaid Other | Attending: Surgery | Admitting: Surgery

## 2023-03-29 ENCOUNTER — Encounter (HOSPITAL_COMMUNITY): Admission: EM | Disposition: A | Discharge status: Home / Self Care | Payer: Self-pay | Source: Home / Self Care

## 2023-03-29 DIAGNOSIS — F4310 Post-traumatic stress disorder, unspecified: Secondary | ICD-10-CM | POA: Diagnosis present

## 2023-03-29 DIAGNOSIS — F430 Acute stress reaction: Secondary | ICD-10-CM | POA: Diagnosis present

## 2023-03-29 DIAGNOSIS — T148XXA Other injury of unspecified body region, initial encounter: Secondary | ICD-10-CM | POA: Diagnosis present

## 2023-03-29 DIAGNOSIS — F329 Major depressive disorder, single episode, unspecified: Secondary | ICD-10-CM | POA: Diagnosis present

## 2023-03-29 DIAGNOSIS — F1290 Cannabis use, unspecified, uncomplicated: Secondary | ICD-10-CM | POA: Diagnosis present

## 2023-03-29 DIAGNOSIS — F419 Anxiety disorder, unspecified: Secondary | ICD-10-CM | POA: Diagnosis present

## 2023-03-29 DIAGNOSIS — F1020 Alcohol dependence, uncomplicated: Secondary | ICD-10-CM | POA: Diagnosis present

## 2023-03-29 DIAGNOSIS — Z79899 Other long term (current) drug therapy: Secondary | ICD-10-CM

## 2023-03-29 DIAGNOSIS — S31109A Unspecified open wound of abdominal wall, unspecified quadrant without penetration into peritoneal cavity, initial encounter: Secondary | ICD-10-CM

## 2023-03-29 DIAGNOSIS — F17210 Nicotine dependence, cigarettes, uncomplicated: Secondary | ICD-10-CM | POA: Diagnosis present

## 2023-03-29 DIAGNOSIS — Y92000 Kitchen of unspecified non-institutional (private) residence as  the place of occurrence of the external cause: Secondary | ICD-10-CM

## 2023-03-29 DIAGNOSIS — K661 Hemoperitoneum: Secondary | ICD-10-CM | POA: Diagnosis present

## 2023-03-29 DIAGNOSIS — S31119A Laceration without foreign body of abdominal wall, unspecified quadrant without penetration into peritoneal cavity, initial encounter: Secondary | ICD-10-CM | POA: Diagnosis present

## 2023-03-29 DIAGNOSIS — F609 Personality disorder, unspecified: Secondary | ICD-10-CM | POA: Diagnosis present

## 2023-03-29 DIAGNOSIS — Z23 Encounter for immunization: Secondary | ICD-10-CM | POA: Diagnosis not present

## 2023-03-29 DIAGNOSIS — S31111A Laceration without foreign body of abdominal wall, left upper quadrant without penetration into peritoneal cavity, initial encounter: Secondary | ICD-10-CM | POA: Diagnosis not present

## 2023-03-29 DIAGNOSIS — F10239 Alcohol dependence with withdrawal, unspecified: Secondary | ICD-10-CM | POA: Diagnosis present

## 2023-03-29 DIAGNOSIS — W01118A Fall on same level from slipping, tripping and stumbling with subsequent striking against other sharp object, initial encounter: Secondary | ICD-10-CM | POA: Diagnosis present

## 2023-03-29 HISTORY — PX: LAPAROTOMY: SHX154

## 2023-03-29 HISTORY — PX: LAPAROSCOPY: SHX197

## 2023-03-29 LAB — COMPREHENSIVE METABOLIC PANEL
ALT: 29 U/L (ref 0–44)
AST: 29 U/L (ref 15–41)
Albumin: 4.4 g/dL (ref 3.5–5.0)
Alkaline Phosphatase: 65 U/L (ref 38–126)
Anion gap: 12 (ref 5–15)
BUN: 13 mg/dL (ref 6–20)
CO2: 23 mmol/L (ref 22–32)
Calcium: 8.7 mg/dL — ABNORMAL LOW (ref 8.9–10.3)
Chloride: 101 mmol/L (ref 98–111)
Creatinine, Ser: 0.7 mg/dL (ref 0.61–1.24)
GFR, Estimated: >60 mL/min (ref >60)
Glucose, Bld: 187 mg/dL — ABNORMAL HIGH (ref 70–99)
Potassium: 3 mmol/L — ABNORMAL LOW (ref 3.5–5.1)
Sodium: 136 mmol/L (ref 135–145)
Total Bilirubin: 1 mg/dL (ref 0.0–1.2)
Total Protein: 7.3 g/dL (ref 6.5–8.1)

## 2023-03-29 LAB — CBC
HCT: 44.8 % (ref 39.0–52.0)
HCT: 42.9 % (ref 39.0–52.0)
Hemoglobin: 15.8 g/dL (ref 13.0–17.0)
Hemoglobin: 14.9 g/dL (ref 13.0–17.0)
MCH: 32.4 pg (ref 26.0–34.0)
MCH: 31.4 pg (ref 26.0–34.0)
MCHC: 35.3 g/dL (ref 30.0–36.0)
MCHC: 34.7 g/dL (ref 30.0–36.0)
MCV: 91.8 fL (ref 80.0–100.0)
MCV: 90.5 fL (ref 80.0–100.0)
Platelets: 245 10*3/uL (ref 150–400)
Platelets: 256 10*3/uL (ref 150–400)
RBC: 4.88 MIL/uL (ref 4.22–5.81)
RBC: 4.74 MIL/uL (ref 4.22–5.81)
RDW: 11.9 % (ref 11.5–15.5)
RDW: 11.8 % (ref 11.5–15.5)
WBC: 10.7 10*3/uL — ABNORMAL HIGH (ref 4.0–10.5)
WBC: 10.5 10*3/uL (ref 4.0–10.5)
nRBC: 0 % (ref 0.0–0.2)
nRBC: 0 % (ref 0.0–0.2)

## 2023-03-29 LAB — HIV ANTIBODY (ROUTINE TESTING W REFLEX): HIV Screen 4th Generation wRfx: NONREACTIVE

## 2023-03-29 LAB — BASIC METABOLIC PANEL
Anion gap: 13 (ref 5–15)
BUN: 11 mg/dL (ref 6–20)
CO2: 21 mmol/L — ABNORMAL LOW (ref 22–32)
Calcium: 8.9 mg/dL (ref 8.9–10.3)
Chloride: 107 mmol/L (ref 98–111)
Creatinine, Ser: 0.9 mg/dL (ref 0.61–1.24)
GFR, Estimated: >60 mL/min (ref >60)
Glucose, Bld: 165 mg/dL — ABNORMAL HIGH (ref 70–99)
Potassium: 3.4 mmol/L — ABNORMAL LOW (ref 3.5–5.1)
Sodium: 141 mmol/L (ref 135–145)

## 2023-03-29 LAB — SAMPLE TO BLOOD BANK

## 2023-03-29 LAB — I-STAT CG4 LACTIC ACID, ED: Lactic Acid, Venous: 2.6 mmol/L (ref 0.5–1.9)

## 2023-03-29 LAB — ETHANOL: Alcohol, Ethyl (B): 118 mg/dL — ABNORMAL HIGH (ref <10)

## 2023-03-29 SURGERY — LAPAROSCOPY, DIAGNOSTIC
Anesthesia: General

## 2023-03-29 MED ORDER — ADULT MULTIVITAMIN W/MINERALS CH
1.0000 | ORAL_TABLET | Freq: Every day | ORAL | Status: DC
  Administered 2023-03-29 – 2023-03-31 (×3): 1 via ORAL
  Filled 2023-03-29 (×3): qty 1

## 2023-03-29 MED ORDER — DOCUSATE SODIUM 100 MG PO CAPS
100.0000 mg | ORAL_CAPSULE | Freq: Two times a day (BID) | ORAL | Status: DC
  Administered 2023-03-29 – 2023-03-31 (×5): 100 mg via ORAL
  Filled 2023-03-29 (×5): qty 1

## 2023-03-29 MED ORDER — ONDANSETRON HCL 4 MG/2ML IJ SOLN
INTRAMUSCULAR | Status: AC
  Filled 2023-03-29: qty 2

## 2023-03-29 MED ORDER — FENTANYL CITRATE (PF) 100 MCG/2ML IJ SOLN
25.0000 ug | INTRAMUSCULAR | Status: DC | PRN
  Administered 2023-03-29 (×3): 50 ug via INTRAVENOUS

## 2023-03-29 MED ORDER — ONDANSETRON HCL 4 MG/2ML IJ SOLN: 4.0000 mg | Freq: Four times a day (QID) | INTRAMUSCULAR | Status: DC | PRN

## 2023-03-29 MED ORDER — METOCLOPRAMIDE HCL 5 MG/ML IJ SOLN: 10.0000 mg | Freq: Once | INTRAMUSCULAR | Status: DC

## 2023-03-29 MED ORDER — THIAMINE HCL 100 MG/ML IJ SOLN: 100.0000 mg | Freq: Every day | INTRAMUSCULAR | Status: DC

## 2023-03-29 MED ORDER — METHOCARBAMOL 1000 MG/10ML IJ SOLN: 1000.0000 mg | Freq: Three times a day (TID) | INTRAMUSCULAR | Status: DC

## 2023-03-29 MED ORDER — DEXAMETHASONE SODIUM PHOSPHATE 10 MG/ML IJ SOLN
INTRAMUSCULAR | Status: DC | PRN
  Administered 2023-03-29: 10 mg via INTRAVENOUS

## 2023-03-29 MED ORDER — SUCCINYLCHOLINE CHLORIDE 200 MG/10ML IV SOSY
PREFILLED_SYRINGE | INTRAVENOUS | Status: DC | PRN
  Administered 2023-03-29: 100 mg via INTRAVENOUS

## 2023-03-29 MED ORDER — THIAMINE MONONITRATE 100 MG PO TABS
100.0000 mg | ORAL_TABLET | Freq: Every day | ORAL | Status: DC
  Administered 2023-03-29 – 2023-03-31 (×3): 100 mg via ORAL
  Filled 2023-03-29 (×3): qty 1

## 2023-03-29 MED ORDER — LORAZEPAM 1 MG PO TABS
1.0000 mg | ORAL_TABLET | ORAL | Status: DC | PRN
  Filled 2023-03-29: qty 2

## 2023-03-29 MED ORDER — DEXMEDETOMIDINE HCL IN NACL 80 MCG/20ML IV SOLN
INTRAVENOUS | Status: DC | PRN
  Administered 2023-03-29 (×2): 8 ug via INTRAVENOUS

## 2023-03-29 MED ORDER — HYDROMORPHONE HCL 1 MG/ML IJ SOLN
0.2500 mg | INTRAMUSCULAR | Status: DC | PRN
  Administered 2023-03-29 (×3): 0.5 mg via INTRAVENOUS

## 2023-03-29 MED ORDER — SUCCINYLCHOLINE CHLORIDE 200 MG/10ML IV SOSY
PREFILLED_SYRINGE | INTRAVENOUS | Status: AC
  Filled 2023-03-29: qty 10

## 2023-03-29 MED ORDER — 0.9 % SODIUM CHLORIDE (POUR BTL) OPTIME
TOPICAL | Status: DC | PRN
  Administered 2023-03-29: 1000 mL

## 2023-03-29 MED ORDER — HYDRALAZINE HCL 20 MG/ML IJ SOLN: 10.0000 mg | INTRAMUSCULAR | Status: DC | PRN

## 2023-03-29 MED ORDER — FENTANYL CITRATE (PF) 100 MCG/2ML IJ SOLN
INTRAMUSCULAR | Status: AC
  Filled 2023-03-29: qty 2

## 2023-03-29 MED ORDER — OXYCODONE HCL 5 MG PO TABS: 5.0000 mg | ORAL_TABLET | Freq: Once | ORAL | Status: DC | PRN

## 2023-03-29 MED ORDER — LIDOCAINE 2% (20 MG/ML) 5 ML SYRINGE
INTRAMUSCULAR | Status: AC
  Filled 2023-03-29: qty 5

## 2023-03-29 MED ORDER — FENTANYL CITRATE (PF) 250 MCG/5ML IJ SOLN
INTRAMUSCULAR | Status: AC
  Filled 2023-03-29: qty 5

## 2023-03-29 MED ORDER — METHOCARBAMOL 500 MG PO TABS
500.0000 mg | ORAL_TABLET | Freq: Three times a day (TID) | ORAL | Status: DC
  Administered 2023-03-29: 500 mg via ORAL
  Filled 2023-03-29: qty 1

## 2023-03-29 MED ORDER — FENTANYL CITRATE (PF) 100 MCG/2ML IJ SOLN
INTRAMUSCULAR | Status: DC | PRN
  Administered 2023-03-29 (×3): 50 ug via INTRAVENOUS
  Administered 2023-03-29: 100 ug via INTRAVENOUS

## 2023-03-29 MED ORDER — LORAZEPAM 2 MG/ML IJ SOLN: 1.0000 mg | INTRAMUSCULAR | Status: DC | PRN

## 2023-03-29 MED ORDER — METOPROLOL TARTRATE 5 MG/5ML IV SOLN: 5.0000 mg | Freq: Four times a day (QID) | INTRAVENOUS | Status: DC | PRN

## 2023-03-29 MED ORDER — PROPOFOL 10 MG/ML IV BOLUS
INTRAVENOUS | Status: DC | PRN
  Administered 2023-03-29: 150 mg via INTRAVENOUS

## 2023-03-29 MED ORDER — SPIRITUS FRUMENTI
1.0000 | Freq: Two times a day (BID) | ORAL | Status: DC
  Administered 2023-03-29: 1 via ORAL
  Filled 2023-03-29 (×3): qty 1

## 2023-03-29 MED ORDER — MORPHINE SULFATE (PF) 2 MG/ML IV SOLN
2.0000 mg | INTRAVENOUS | Status: DC | PRN
  Administered 2023-03-29 (×2): 4 mg via INTRAVENOUS
  Administered 2023-03-31: 2 mg via INTRAVENOUS
  Filled 2023-03-29 (×2): qty 2
  Filled 2023-03-29: qty 1
  Filled 2023-03-29: qty 2

## 2023-03-29 MED ORDER — OXYCODONE HCL 5 MG PO TABS
5.0000 mg | ORAL_TABLET | ORAL | Status: DC | PRN
  Administered 2023-03-29 – 2023-03-30 (×5): 10 mg via ORAL
  Administered 2023-03-30 (×2): 5 mg via ORAL
  Administered 2023-03-31: 10 mg via ORAL
  Filled 2023-03-29 (×6): qty 2
  Filled 2023-03-29: qty 1
  Filled 2023-03-29: qty 2

## 2023-03-29 MED ORDER — LORAZEPAM 1 MG PO TABS
0.0000 mg | ORAL_TABLET | Freq: Four times a day (QID) | ORAL | Status: DC
  Administered 2023-03-30: 1 mg via ORAL

## 2023-03-29 MED ORDER — LACTATED RINGERS IV SOLN: INTRAVENOUS | Status: AC

## 2023-03-29 MED ORDER — ALBUMIN HUMAN 5 % IV SOLN: INTRAVENOUS | Status: DC | PRN

## 2023-03-29 MED ORDER — EPHEDRINE SULFATE (PRESSORS) 50 MG/ML IJ SOLN
INTRAMUSCULAR | Status: DC | PRN
  Administered 2023-03-29: 10 mg via INTRAVENOUS

## 2023-03-29 MED ORDER — HYDROMORPHONE HCL 1 MG/ML IJ SOLN
INTRAMUSCULAR | Status: AC
  Filled 2023-03-29: qty 1

## 2023-03-29 MED ORDER — METHOCARBAMOL 500 MG PO TABS
1000.0000 mg | ORAL_TABLET | Freq: Three times a day (TID) | ORAL | Status: DC
  Administered 2023-03-29 – 2023-03-31 (×6): 1000 mg via ORAL
  Filled 2023-03-29 (×6): qty 2

## 2023-03-29 MED ORDER — ONDANSETRON 4 MG PO TBDP: 4.0000 mg | ORAL_TABLET | Freq: Four times a day (QID) | ORAL | Status: DC | PRN

## 2023-03-29 MED ORDER — ONDANSETRON HCL 4 MG/2ML IJ SOLN
INTRAMUSCULAR | Status: DC | PRN
  Administered 2023-03-29: 4 mg via INTRAVENOUS

## 2023-03-29 MED ORDER — POLYETHYLENE GLYCOL 3350 17 G PO PACK: 17.0000 g | PACK | Freq: Every day | ORAL | Status: DC | PRN

## 2023-03-29 MED ORDER — TETANUS-DIPHTH-ACELL PERTUSSIS 5-2.5-18.5 LF-MCG/0.5 IM SUSY
0.5000 mL | PREFILLED_SYRINGE | Freq: Once | INTRAMUSCULAR | Status: DC
  Filled 2023-03-29: qty 0.5

## 2023-03-29 MED ORDER — LACTATED RINGERS IV BOLUS: 1000.0000 mL | Freq: Once | INTRAVENOUS | Status: DC

## 2023-03-29 MED ORDER — LIDOCAINE HCL (CARDIAC) PF 100 MG/5ML IV SOSY
PREFILLED_SYRINGE | INTRAVENOUS | Status: DC | PRN
  Administered 2023-03-29: 40 mg via INTRAVENOUS

## 2023-03-29 MED ORDER — SUGAMMADEX SODIUM 200 MG/2ML IV SOLN
INTRAVENOUS | Status: DC | PRN
  Administered 2023-03-29: 200 mg via INTRAVENOUS

## 2023-03-29 MED ORDER — METHOCARBAMOL 1000 MG/10ML IJ SOLN: 500.0000 mg | Freq: Three times a day (TID) | INTRAMUSCULAR | Status: DC

## 2023-03-29 MED ORDER — ONDANSETRON HCL 4 MG/2ML IJ SOLN
4.0000 mg | Freq: Once | INTRAMUSCULAR | Status: AC
Start: 2023-03-29 — End: 2023-03-29
  Administered 2023-03-29: 4 mg via INTRAVENOUS
  Filled 2023-03-29: qty 2

## 2023-03-29 MED ORDER — OXYCODONE-ACETAMINOPHEN 5-325 MG PO TABS: 1.0000 | ORAL_TABLET | ORAL | 0 refills | Status: DC | PRN

## 2023-03-29 MED ORDER — LACTATED RINGERS IV SOLN: INTRAVENOUS | Status: DC | PRN

## 2023-03-29 MED ORDER — ENOXAPARIN SODIUM 30 MG/0.3ML IJ SOSY
30.0000 mg | PREFILLED_SYRINGE | Freq: Two times a day (BID) | INTRAMUSCULAR | Status: DC
  Administered 2023-03-30 – 2023-03-31 (×3): 30 mg via SUBCUTANEOUS
  Filled 2023-03-29 (×3): qty 0.3

## 2023-03-29 MED ORDER — CEFAZOLIN SODIUM-DEXTROSE 2-3 GM-%(50ML) IV SOLR
INTRAVENOUS | Status: DC | PRN
  Administered 2023-03-29: 2 g via INTRAVENOUS

## 2023-03-29 MED ORDER — PROPOFOL 10 MG/ML IV BOLUS
INTRAVENOUS | Status: AC
  Filled 2023-03-29: qty 20

## 2023-03-29 MED ORDER — ACETAMINOPHEN 500 MG PO TABS
1000.0000 mg | ORAL_TABLET | Freq: Four times a day (QID) | ORAL | Status: DC
  Administered 2023-03-29 – 2023-03-31 (×8): 1000 mg via ORAL
  Filled 2023-03-29 (×8): qty 2

## 2023-03-29 MED ORDER — HYDROMORPHONE HCL 1 MG/ML IJ SOLN: 0.5000 mg | Freq: Once | INTRAMUSCULAR | Status: DC

## 2023-03-29 MED ORDER — MIDAZOLAM HCL 2 MG/2ML IJ SOLN
INTRAMUSCULAR | Status: AC
Start: 2023-03-29 — End: ?
  Filled 2023-03-29: qty 2

## 2023-03-29 MED ORDER — ROCURONIUM BROMIDE 100 MG/10ML IV SOLN
INTRAVENOUS | Status: DC | PRN
  Administered 2023-03-29: 40 mg via INTRAVENOUS

## 2023-03-29 MED ORDER — LORAZEPAM 1 MG PO TABS: 0.0000 mg | ORAL_TABLET | Freq: Two times a day (BID) | ORAL | Status: DC

## 2023-03-29 MED ORDER — MORPHINE SULFATE (PF) 4 MG/ML IV SOLN
4.0000 mg | Freq: Once | INTRAVENOUS | Status: AC
  Administered 2023-03-29: 4 mg via INTRAVENOUS
  Filled 2023-03-29: qty 1

## 2023-03-29 MED ORDER — MORPHINE SULFATE (PF) 2 MG/ML IV SOLN
2.0000 mg | INTRAVENOUS | Status: DC | PRN
  Administered 2023-03-29 (×2): 2 mg via INTRAVENOUS
  Filled 2023-03-29 (×2): qty 1

## 2023-03-29 MED ORDER — BUPIVACAINE HCL (PF) 0.25 % IJ SOLN
INTRAMUSCULAR | Status: AC
  Filled 2023-03-29: qty 30

## 2023-03-29 MED ORDER — OXYCODONE HCL 5 MG/5ML PO SOLN: 5.0000 mg | Freq: Once | ORAL | Status: DC | PRN

## 2023-03-29 MED ORDER — IOHEXOL 350 MG/ML SOLN
80.0000 mL | Freq: Once | INTRAVENOUS | Status: AC | PRN
  Administered 2023-03-29: 80 mL via INTRAVENOUS

## 2023-03-29 MED ORDER — FOLIC ACID 1 MG PO TABS
1.0000 mg | ORAL_TABLET | Freq: Every day | ORAL | Status: DC
  Administered 2023-03-29 – 2023-03-31 (×3): 1 mg via ORAL
  Filled 2023-03-29 (×3): qty 1

## 2023-03-29 MED ORDER — PHENYLEPHRINE HCL (PRESSORS) 10 MG/ML IV SOLN
INTRAVENOUS | Status: DC | PRN
  Administered 2023-03-29 (×2): 160 ug via INTRAVENOUS
  Administered 2023-03-29: 80 ug via INTRAVENOUS
  Administered 2023-03-29: 160 ug via INTRAVENOUS

## 2023-03-29 MED ORDER — MIDAZOLAM HCL 5 MG/5ML IJ SOLN
INTRAMUSCULAR | Status: DC | PRN
  Administered 2023-03-29: 2 mg via INTRAVENOUS

## 2023-03-29 SURGICAL SUPPLY — 64 items
BAG COUNTER SPONGE SURGICOUNT (BAG) ×1 IMPLANT
BLADE CLIPPER SURG (BLADE) IMPLANT
CANISTER SUCT 3000ML PPV (MISCELLANEOUS) ×1 IMPLANT
CHLORAPREP W/TINT 26 (MISCELLANEOUS) ×1 IMPLANT
COVER SURGICAL LIGHT HANDLE (MISCELLANEOUS) ×1 IMPLANT
DERMABOND ADVANCED .7 DNX12 (GAUZE/BANDAGES/DRESSINGS) ×1 IMPLANT
DRAPE LAPAROSCOPIC ABDOMINAL (DRAPES) ×1 IMPLANT
DRAPE WARM FLUID 44X44 (DRAPES) ×1 IMPLANT
DRSG OPSITE POSTOP 4X10 (GAUZE/BANDAGES/DRESSINGS) IMPLANT
DRSG OPSITE POSTOP 4X8 (GAUZE/BANDAGES/DRESSINGS) IMPLANT
ELECT BLADE 6.5 EXT (BLADE) IMPLANT
ELECT CAUTERY BLADE 6.4 (BLADE) ×1 IMPLANT
ELECT REM PT RETURN 9FT ADLT (ELECTROSURGICAL) ×1 IMPLANT
ELECTRODE REM PT RTRN 9FT ADLT (ELECTROSURGICAL) ×1 IMPLANT
GAUZE SPONGE 4X4 12PLY STRL (GAUZE/BANDAGES/DRESSINGS) IMPLANT
GLOVE BIO SURGEON STRL SZ7.5 (GLOVE) ×1 IMPLANT
GLOVE BIOGEL PI IND STRL 8 (GLOVE) ×1 IMPLANT
GOWN STRL REUS W/ TWL LRG LVL3 (GOWN DISPOSABLE) ×2 IMPLANT
GOWN STRL REUS W/ TWL XL LVL3 (GOWN DISPOSABLE) ×1 IMPLANT
HANDLE SUCTION POOLE (INSTRUMENTS) ×1 IMPLANT
IRRIG SUCT STRYKERFLOW 2 WTIP (MISCELLANEOUS) IMPLANT
IRRIGATION SUCT STRKRFLW 2 WTP (MISCELLANEOUS) IMPLANT
KIT BASIN OR (CUSTOM PROCEDURE TRAY) ×1 IMPLANT
KIT TURNOVER KIT B (KITS) ×1 IMPLANT
LIGASURE IMPACT 36 18CM CVD LR (INSTRUMENTS) IMPLANT
NDL 22X1.5 STRL (OR ONLY) (MISCELLANEOUS) ×1 IMPLANT
NDL INSUFFLATION 14GA 120MM (NEEDLE) ×1 IMPLANT
NEEDLE 22X1.5 STRL (OR ONLY) (MISCELLANEOUS) ×1 IMPLANT
NEEDLE INSUFFLATION 14GA 120MM (NEEDLE) ×1 IMPLANT
NS IRRIG 1000ML POUR BTL (IV SOLUTION) ×2 IMPLANT
PACK GENERAL/GYN (CUSTOM PROCEDURE TRAY) ×1 IMPLANT
PAD ARMBOARD 7.5X6 YLW CONV (MISCELLANEOUS) ×2 IMPLANT
PENCIL BUTTON HOLSTER BLD 10FT (ELECTRODE) IMPLANT
PENCIL SMOKE EVACUATOR (MISCELLANEOUS) ×1 IMPLANT
SCISSORS LAP 5X35 DISP (ENDOMECHANICALS) IMPLANT
SET TUBE SMOKE EVAC HIGH FLOW (TUBING) ×1 IMPLANT
SLEEVE Z-THREAD 5X100MM (TROCAR) ×1 IMPLANT
SPECIMEN JAR LARGE (MISCELLANEOUS) IMPLANT
SPONGE T-LAP 18X18 ~~LOC~~+RFID (SPONGE) IMPLANT
STAPLER SKIN PROX WIDE 3.9 (STAPLE) IMPLANT
STAPLER VISISTAT 35W (STAPLE) ×1 IMPLANT
SUCTION POOLE HANDLE (INSTRUMENTS) ×1 IMPLANT
SUT MNCRL AB 4-0 PS2 18 (SUTURE) ×1 IMPLANT
SUT PDS AB 0 CT1 27 (SUTURE) IMPLANT
SUT PDS AB 1 TP1 54 (SUTURE) ×2 IMPLANT
SUT PDS AB 1 TP1 96 (SUTURE) IMPLANT
SUT SILK 2 0 SH CR/8 (SUTURE) ×1 IMPLANT
SUT SILK 2 0 TIES 10X30 (SUTURE) ×1 IMPLANT
SUT SILK 2-0 18XBRD TIE 12 (SUTURE) IMPLANT
SUT SILK 3 0 SH CR/8 (SUTURE) ×1 IMPLANT
SUT SILK 3 0 TIES 10X30 (SUTURE) ×1 IMPLANT
SUT SILK 3-0 18XBRD TIE 12 (SUTURE) IMPLANT
SUT VIC AB 2-0 SH 18 (SUTURE) IMPLANT
TOWEL GREEN STERILE (TOWEL DISPOSABLE) ×1 IMPLANT
TOWEL GREEN STERILE FF (TOWEL DISPOSABLE) ×1 IMPLANT
TRAY FOLEY MTR SLVR 16FR STAT (SET/KITS/TRAYS/PACK) ×1 IMPLANT
TRAY LAPAROSCOPIC MC (CUSTOM PROCEDURE TRAY) ×1 IMPLANT
TROCAR 11X100 Z THREAD (TROCAR) IMPLANT
TROCAR BALLN 12MMX100 BLUNT (TROCAR) IMPLANT
TROCAR XCEL NON-BLD 5MMX100MML (ENDOMECHANICALS) ×1 IMPLANT
TROCAR Z THREAD OPTICAL 12X100 (TROCAR) IMPLANT
TUBE CONNECTING 12X1/4 (SUCTIONS) IMPLANT
WARMER LAPAROSCOPE (MISCELLANEOUS) ×1 IMPLANT
YANKAUER SUCT BULB TIP NO VENT (SUCTIONS) IMPLANT
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
## 2023-03-29 NOTE — ED Notes (Signed)
 Pt reports drinking "a lot of liqueur"

## 2023-03-29 NOTE — ED Notes (Signed)
 [facility] vitals at 0400 Pulse 88 134/74 bp  Rr 18 99% ra
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
## 2023-03-29 NOTE — Progress Notes (Signed)
 Transition of Care [facility]) - CAGE-AID Screening   Patient Details  Name: William Caldwell MRN: [id number] Date of Birth: May 27, 1995  Transition of Care [facility]) CM/SW Contact:    Sallyanne MALVA Mettle, RN Phone Number: 03/29/2023, 6:45 AM   Clinical Narrative:  Reports drinking at least 750 ML of liquor every day, needs TOC consult.   CAGE-AID Screening:    Have You Ever Felt You Ought to Cut Down on Your Drinking or Drug Use?: Yes Have People Annoyed You By Critizing Your Drinking Or Drug Use?: Yes Have You Felt Bad Or Guilty About Your Drinking Or Drug Use?: Yes Have You Ever Had a Drink or Used Drugs First Thing In The Morning to Steady Your Nerves or to Get Rid of a Hangover?: Yes CAGE-AID Score: 4  Substance Abuse Education Offered: Yes  Substance abuse interventions: Referral to (must comment) (needs TOC consult)
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
## 2023-03-29 NOTE — Transfer of Care (Signed)
 Immediate Anesthesia Transfer of Care Note  Patient: William Caldwell  Procedure(s) Performed: LAPAROSCOPY DIAGNOSTIC EXPLORATORY LAPAROTOMY  Patient Location: PACU  Anesthesia Type:General  Level of Consciousness: drowsy  Airway & Oxygen Therapy: Patient Spontanous Breathing and Patient connected to nasal cannula oxygen  Post-op Assessment: Report given to RN, Post -op Vital signs reviewed and stable, and Patient moving all extremities  Post vital signs: Reviewed and stable  Last Vitals:  Vitals Value Taken Time  BP 135/91 03/29/23 0615  Temp 36.1 C 03/29/23 0613  Pulse 85 03/29/23 0625  Resp 8 03/29/23 0625  SpO2 100 % 03/29/23 0625  Vitals shown include unfiled device data.  Last Pain:  Vitals:   03/29/23 0613  TempSrc:   PainSc: 0-No pain         Complications: No notable events documented.
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
## 2023-03-29 NOTE — Discharge Instructions (Signed)
 POST OPERATIVE INSTRUCTIONS  Thinking Clearly  The anesthesia may cause you to feel different for 1 or 2 days. Do not drive, drink alcohol , or make any big decisions for at least 2 days.  Nutrition When you wake up, you will be able to drink small amounts of liquid. If you do not feel sick, you can slowly advance your diet to regular foods. Continue to drink lots of fluids, usually about 8 to 10 glasses per day. Eat a high-fiber diet so you don't strain during bowel movements. High-Fiber Foods Foods high in fiber include beans, bran cereals and whole-grain breads, peas, dried fruit (figs, apricots, and dates), raspberries, blackberries, strawberries, sweet corn, broccoli, baked potatoes with skin, plums, pears, apples, greens, and nuts. Activity Slowly increase your activity. Be sure to get up and walk every hour or so to prevent blood clots. No heavy lifting or strenuous activity for 4 weeks following surgery to prevent hernias at your incision sites It is normal to feel tired. You may need more sleep than usual.  Get your rest but make sure to get up and move around frequently to prevent blood clots and pneumonia.  Work and Return to [employer] can go back to work when you feel well enough. Discuss the timing with your surgeon. You can usually go back to school or work 1 week or less after an laparoscopic surgery and two weeks after an open surgery. If your work requires heavy lifting or strenuous activity you need to be placed on light duty for 4 weeks following surgery. You can return to gym class, sports or other physical activities 4 weeks after surgery.  Wound Care Always wash your hands before and after touching near your incision site. Do not soak in a bathtub until cleared at your follow up appointment. You may take a shower 24 hours after surgery. A small amount of drainage from the incision is normal. If the drainage is thick and yellow or the site is red, you may have an  infection, so call your surgeon. If you have a drain in one of your incisions, it will be taken out in office when the drainage stops. Steri-Strips will fall off in 7 to 10 days or they will be removed during your first office visit. If you have dermabond glue covering over the incision, allow the glue to flake off on its own. Avoid wearing tight or rough clothing. It may rub your incisions and make it harder for them to heal. Protect the new skin, especially from the sun. The sun can burn and cause darker scarring. Your scar will heal in about 4 to 6 weeks and will become softer and continue to fade over the next year.  The cosmetic appearance of the incisions will improve over the course of the first year after surgery. Sensation around your incision will return in a few weeks or months.  Bowel Movements After intestinal surgery, you may have loose watery stools for several days. If watery diarrhea lasts longer than 3 days, contact your surgeon. Pain medication (narcotics) can cause constipation. Increase the fiber in your diet with high-fiber foods if you are constipated. You can take an over the counter stool softener like Colace to avoid constipation.  Additional over the counter medications can also be used if Colace isn't sufficient (for example, Milk of Magnesia or Miralax ).  Pain The amount of pain is different for each person. Some people need only 1 to 3 doses of pain control medication,  while others need more. Take alternating doses of tylenol  and ibuprofen around the clock for the first five days following surgery.  This will provide a baseline of pain control and help with inflammation.  Take the narcotic pain medication in addition if needed for severe pain.  Contact Your Surgeon at [phone], if you have: Pain that will not go away Pain that gets worse A fever of more than 101F (38.3C) Repeated vomiting Swelling, redness, bleeding, or bad-smelling drainage from your wound  site Strong abdominal pain No bowel movement or unable to pass gas for 3 days Watery diarrhea lasting longer than 3 days  Pain Control The goal of pain control is to minimize pain, keep you moving and help you heal. Your surgical team will work with you on your pain plan. Most often a combination of therapies and medications are used to control your pain. You may also be given medication (local anesthetic) at the surgical site. This may help control your pain for several days. Extreme pain puts extra stress on your body at a time when your body needs to focus on healing. Do not wait until your pain has reached a level "10" or is unbearable before telling your doctor or nurse. It is much easier to control pain before it becomes severe. Following a laparoscopic procedure, pain is sometimes felt in the shoulder. This is due to the gas inserted into your abdomen during the procedure. Moving and walking helps to decrease the gas and the right shoulder pain.  Use the guide below for ways to manage your post-operative pain. Learn more by going to facs.org/safepaincontrol.  How Intense Is My Pain Common Therapies to Feel Better       I hardly notice my pain, and it does not interfere with my activities.  I notice my pain and it distracts me, but I can still do activities (sitting up, walking, standing).  Non-Medication Therapies  Ice (in a bag, applied over clothing at the surgical site), elevation, rest, meditation, massage, distraction (music, TV, play) walking and mild exercise Splinting the abdomen with pillows +  Non-Opioid Medications Acetaminophen  (Tylenol ) Non-steroidal anti-inflammatory drugs (NSAIDS) Aspirin, Ibuprofen (Motrin, Advil) Naproxen  (Aleve ) Take these as needed, when you feel pain. Both acetaminophen  and NSAIDs help to decrease pain and swelling (inflammation).      My pain is hard to ignore and is more noticeable even when I rest.  My pain interferes with my  usual activities.  Non-Medication Therapies  +  Non-Opioid medications  Take on a regular schedule (around-the-clock) instead of as needed. (For example, Tylenol  every 6 hours at 9:00 am, 3:00 pm, 9:00 pm, 3:00 am and Motrin every 6 hours at 12:00 am, 6:00 am, 12:00 pm, 6:00 pm)         I am focused on my pain, and I am not doing my daily activities.  I am groaning in pain, and I cannot sleep. I am unable to do anything.  My pain is as bad as it could be, and nothing else matters.  Non-Medication Therapies  +  Around-the-Clock Non-Opioid Medications  +  Short-acting opioids  Opioids should be used with other medications to manage severe pain. Opioids block pain and give a feeling of euphoria (feel high). Addiction, a serious side effect of opioids, is rare with short-term (a few days) use.  Examples of short-acting opioids include: Tramadol (Ultram), Hydrocodone (Norco, Vicodin), Hydromorphone  (Dilaudid ), Oxycodone  (Oxycontin )     The above directions have been adapted from the  [employer] Surgical Patient Education Program.  Please refer to the ACS website if needed: [email or web address].[email or web address]   Deward Foy, MD [facility] [address], [address], [place], [place]  [address] ?  [address] [address], [place], [place]   [address] [phone] ? [phone] ? FAX [phone] Web site: [email or web address]
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
## 2023-03-29 NOTE — ED Notes (Signed)
 Pt going to CT
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
## 2023-03-29 NOTE — Progress Notes (Signed)
 Orthopedic Tech Progress Note Patient Details:  William Caldwell [birth year] [id number]  Patient ID: William Caldwell, male   DOB: July 02, 1995, 28 y.o.   MRN: [id number] Level I; not needed. Darleen Crocker 03/29/2023, 4:39 AM

## 2023-03-29 NOTE — Anesthesia Procedure Notes (Signed)
 Procedure Name: Intubation Date/Time: 03/29/2023 4:50 AM  Performed by: Dixie Coppa T, CRNAPre-anesthesia Checklist: Patient identified, Emergency Drugs available, Suction available and Patient being monitored Patient Re-evaluated:Patient Re-evaluated prior to induction Oxygen Delivery Method: Circle system utilized Preoxygenation: Pre-oxygenation with 100% oxygen Induction Type: IV induction Ventilation: Mask ventilation without difficulty Laryngoscope Size: Mac and 4 Grade View: Grade I Tube type: Oral Tube size: 7.5 mm Number of attempts: 1 Airway Equipment and Method: Stylet and Oral airway Placement Confirmation: ETT inserted through vocal cords under direct vision, positive ETCO2 and breath sounds checked- equal and bilateral Secured at: 22 cm Tube secured with: Tape Dental Injury: Teeth and Oropharynx as per pre-operative assessment
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
## 2023-03-30 ENCOUNTER — Encounter (HOSPITAL_COMMUNITY): Payer: Self-pay | Admitting: Surgery

## 2023-03-30 DIAGNOSIS — F430 Acute stress reaction: Secondary | ICD-10-CM | POA: Diagnosis present

## 2023-03-30 DIAGNOSIS — T148XXA Other injury of unspecified body region, initial encounter: Secondary | ICD-10-CM

## 2023-03-30 DIAGNOSIS — F1020 Alcohol dependence, uncomplicated: Secondary | ICD-10-CM | POA: Diagnosis present

## 2023-03-30 LAB — BASIC METABOLIC PANEL
Anion gap: 8 (ref 5–15)
BUN: 9 mg/dL (ref 6–20)
CO2: 29 mmol/L (ref 22–32)
Calcium: 9.4 mg/dL (ref 8.9–10.3)
Chloride: 100 mmol/L (ref 98–111)
Creatinine, Ser: 0.8 mg/dL (ref 0.61–1.24)
GFR, Estimated: >60 mL/min (ref >60)
Glucose, Bld: 120 mg/dL — ABNORMAL HIGH (ref 70–99)
Potassium: 3.5 mmol/L (ref 3.5–5.1)
Sodium: 137 mmol/L (ref 135–145)

## 2023-03-30 LAB — CBC
HCT: 35.3 % — ABNORMAL LOW (ref 39.0–52.0)
Hemoglobin: 12.6 g/dL — ABNORMAL LOW (ref 13.0–17.0)
MCH: 32 pg (ref 26.0–34.0)
MCHC: 35.7 g/dL (ref 30.0–36.0)
MCV: 89.6 fL (ref 80.0–100.0)
Platelets: 204 10*3/uL (ref 150–400)
RBC: 3.94 MIL/uL — ABNORMAL LOW (ref 4.22–5.81)
RDW: 11.9 % (ref 11.5–15.5)
WBC: 14.1 10*3/uL — ABNORMAL HIGH (ref 4.0–10.5)
nRBC: 0 % (ref 0.0–0.2)

## 2023-03-30 MED ORDER — HYDROXYZINE HCL 25 MG PO TABS: 25.0000 mg | ORAL_TABLET | Freq: Four times a day (QID) | ORAL | Status: DC | PRN

## 2023-03-30 MED ORDER — POLYETHYLENE GLYCOL 3350 17 G PO PACK
17.0000 g | PACK | Freq: Every day | ORAL | Status: DC
  Administered 2023-03-30 – 2023-03-31 (×2): 17 g via ORAL
  Filled 2023-03-30 (×2): qty 1

## 2023-03-30 MED ORDER — CHLORDIAZEPOXIDE HCL 25 MG PO CAPS: 25.0000 mg | ORAL_CAPSULE | Freq: Four times a day (QID) | ORAL | Status: DC | PRN

## 2023-03-30 MED ORDER — CHLORDIAZEPOXIDE HCL 25 MG PO CAPS: 25.0000 mg | ORAL_CAPSULE | ORAL | Status: DC

## 2023-03-30 MED ORDER — ESCITALOPRAM OXALATE 10 MG PO TABS
5.0000 mg | ORAL_TABLET | Freq: Every day | ORAL | Status: DC
  Administered 2023-03-30: 5 mg via ORAL
  Filled 2023-03-30: qty 1

## 2023-03-30 MED ORDER — POLYETHYLENE GLYCOL 3350 17 G PO PACK: 17.0000 g | PACK | Freq: Every day | ORAL | Status: DC

## 2023-03-30 MED ORDER — TRAZODONE HCL 50 MG PO TABS: 50.0000 mg | ORAL_TABLET | Freq: Every evening | ORAL | Status: DC | PRN

## 2023-03-30 MED ORDER — CHLORDIAZEPOXIDE HCL 25 MG PO CAPS: 25.0000 mg | ORAL_CAPSULE | Freq: Every day | ORAL | Status: DC

## 2023-03-30 MED ORDER — CHLORDIAZEPOXIDE HCL 25 MG PO CAPS
25.0000 mg | ORAL_CAPSULE | Freq: Four times a day (QID) | ORAL | Status: AC
  Administered 2023-03-30 (×4): 25 mg via ORAL
  Filled 2023-03-30 (×4): qty 1

## 2023-03-30 MED ORDER — CHLORDIAZEPOXIDE HCL 25 MG PO CAPS
25.0000 mg | ORAL_CAPSULE | Freq: Three times a day (TID) | ORAL | Status: DC
  Administered 2023-03-31: 25 mg via ORAL
  Filled 2023-03-30: qty 1

## 2023-03-30 MED ORDER — LOPERAMIDE HCL 2 MG PO CAPS: 2.0000 mg | ORAL_CAPSULE | ORAL | Status: DC | PRN

## 2023-03-30 NOTE — Plan of Care (Signed)
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
## 2023-03-31 ENCOUNTER — Other Ambulatory Visit (HOSPITAL_COMMUNITY): Payer: Self-pay

## 2023-03-31 ENCOUNTER — Other Ambulatory Visit: Payer: Self-pay | Admitting: Surgery

## 2023-03-31 LAB — BASIC METABOLIC PANEL
Anion gap: 8 (ref 5–15)
BUN: 12 mg/dL (ref 6–20)
CO2: 28 mmol/L (ref 22–32)
Calcium: 9.5 mg/dL (ref 8.9–10.3)
Chloride: 101 mmol/L (ref 98–111)
Creatinine, Ser: 0.98 mg/dL (ref 0.61–1.24)
GFR, Estimated: >60 mL/min (ref >60)
Glucose, Bld: 130 mg/dL — ABNORMAL HIGH (ref 70–99)
Potassium: 3.6 mmol/L (ref 3.5–5.1)
Sodium: 137 mmol/L (ref 135–145)

## 2023-03-31 LAB — CBC
HCT: 39.8 % (ref 39.0–52.0)
Hemoglobin: 13.7 g/dL (ref 13.0–17.0)
MCH: 31.2 pg (ref 26.0–34.0)
MCHC: 34.4 g/dL (ref 30.0–36.0)
MCV: 90.7 fL (ref 80.0–100.0)
Platelets: 242 10*3/uL (ref 150–400)
RBC: 4.39 MIL/uL (ref 4.22–5.81)
RDW: 11.8 % (ref 11.5–15.5)
WBC: 6 10*3/uL (ref 4.0–10.5)
nRBC: 0 % (ref 0.0–0.2)

## 2023-03-31 LAB — HEMOGLOBIN A1C
Hgb A1c MFr Bld: 4.7 % — ABNORMAL LOW (ref 4.8–5.6)
Mean Plasma Glucose: 88.19 mg/dL

## 2023-03-31 LAB — GLUCOSE, CAPILLARY: Glucose-Capillary: 89 mg/dL (ref 70–99)

## 2023-03-31 MED ORDER — METHOCARBAMOL 500 MG PO TABS
1000.0000 mg | ORAL_TABLET | Freq: Three times a day (TID) | ORAL | 0 refills | Status: AC | PRN
  Filled 2023-03-31: qty 60, 10d supply, fill #0

## 2023-03-31 MED ORDER — CHLORDIAZEPOXIDE HCL 25 MG PO CAPS
ORAL_CAPSULE | ORAL | 0 refills | Status: AC
  Filled 2023-03-31: qty 6, 3d supply, fill #0

## 2023-03-31 MED ORDER — OXYCODONE HCL 5 MG PO TABS
5.0000 mg | ORAL_TABLET | Freq: Four times a day (QID) | ORAL | 0 refills | Status: DC | PRN
  Filled 2023-03-31: qty 30, 4d supply, fill #0

## 2023-03-31 MED ORDER — ESCITALOPRAM OXALATE 5 MG PO TABS
5.0000 mg | ORAL_TABLET | Freq: Every day | ORAL | 1 refills | Status: DC
  Filled 2023-03-31: qty 30, 30d supply, fill #0

## 2023-03-31 MED ORDER — ACETAMINOPHEN 500 MG PO TABS
1000.0000 mg | ORAL_TABLET | Freq: Four times a day (QID) | ORAL | 0 refills | Status: DC
  Filled 2023-03-31: qty 30, 4d supply, fill #0
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
## 2023-03-31 NOTE — Progress Notes (Signed)
 Orthopedic Tech Progress Note Patient Details:  William Caldwell 14-Apr-1995 [id number]  Ortho Devices Type of Ortho Device: Crutches Ortho Device/Splint Interventions: Ordered, Application, Adjustment   Post Interventions Patient Tolerated: Well, Ambulated well Instructions Provided: Adjustment of device  [name] 03/31/2023, 2:11 PM
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
## 2023-03-31 NOTE — Anesthesia Postprocedure Evaluation (Signed)
 Anesthesia Post Note  Patient: Orthoptist  Procedure(s) Performed: LAPAROSCOPY DIAGNOSTIC EXPLORATORY LAPAROTOMY     Patient location during evaluation: PACU Anesthesia Type: General Level of consciousness: awake and alert Pain management: pain level controlled Vital Signs Assessment: post-procedure vital signs reviewed and stable Respiratory status: spontaneous breathing, nonlabored ventilation, respiratory function stable and patient connected to nasal cannula oxygen Cardiovascular status: blood pressure returned to baseline and stable Postop Assessment: no apparent nausea or vomiting Anesthetic complications: no   No notable events documented.  Last Vitals:  Vitals:   03/31/23 0524 03/31/23 0754  BP: 125/73 113/64  Pulse: 77 70  Resp: 18 16  Temp: 37 C 36.8 C  SpO2: 96% 94%    Last Pain:  Vitals:   03/31/23 0754  TempSrc: Oral  PainSc:                  Winn Muehl S

## 2023-04-01 ENCOUNTER — Other Ambulatory Visit (HOSPITAL_COMMUNITY): Payer: Self-pay

## 2023-04-03 ENCOUNTER — Other Ambulatory Visit (HOSPITAL_COMMUNITY): Payer: Self-pay

## 2023-04-04 ENCOUNTER — Emergency Department (HOSPITAL_COMMUNITY): Admission: EM | Admit: 2023-04-04 | Discharge: 2023-04-04 | Discharge status: Against Medical Advice | Payer: MEDICAID

## 2023-04-04 NOTE — ED Notes (Signed)
Called pt 3 times, no answer.  

## 2023-04-04 NOTE — ED Notes (Signed)
 No answer

## 2023-04-05 ENCOUNTER — Other Ambulatory Visit (HOSPITAL_COMMUNITY): Payer: Self-pay
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
## 2023-06-22 ENCOUNTER — Encounter (HOSPITAL_COMMUNITY): Payer: Self-pay | Admitting: Psychiatry

## 2023-06-22 ENCOUNTER — Ambulatory Visit (HOSPITAL_COMMUNITY)
Admission: EM | Admit: 2023-06-22 | Discharge: 2023-06-22 | Disposition: A | Discharge status: Other Acute Inpatient Hospital | Attending: Nurse Practitioner | Admitting: Nurse Practitioner

## 2023-06-22 ENCOUNTER — Inpatient Hospital Stay (HOSPITAL_COMMUNITY)
Admission: AD | Admit: 2023-06-22 | Discharge: 2023-06-26 | DRG: 885 | Disposition: A | Discharge status: Home / Self Care | Source: Intra-hospital | Attending: Psychiatry | Admitting: Psychiatry

## 2023-06-22 DIAGNOSIS — F19951 Other psychoactive substance use, unspecified with psychoactive substance-induced psychotic disorder with hallucinations: Secondary | ICD-10-CM | POA: Insufficient documentation

## 2023-06-22 DIAGNOSIS — Z9151 Personal history of suicidal behavior: Secondary | ICD-10-CM | POA: Diagnosis not present

## 2023-06-22 DIAGNOSIS — F1020 Alcohol dependence, uncomplicated: Secondary | ICD-10-CM | POA: Diagnosis present

## 2023-06-22 DIAGNOSIS — Z62810 Personal history of physical and sexual abuse in childhood: Secondary | ICD-10-CM

## 2023-06-22 DIAGNOSIS — R45851 Suicidal ideations: Secondary | ICD-10-CM | POA: Diagnosis present

## 2023-06-22 DIAGNOSIS — F411 Generalized anxiety disorder: Secondary | ICD-10-CM | POA: Diagnosis present

## 2023-06-22 DIAGNOSIS — F4310 Post-traumatic stress disorder, unspecified: Secondary | ICD-10-CM | POA: Diagnosis present

## 2023-06-22 DIAGNOSIS — Z62811 Personal history of psychological abuse in childhood: Secondary | ICD-10-CM

## 2023-06-22 DIAGNOSIS — G4700 Insomnia, unspecified: Secondary | ICD-10-CM | POA: Diagnosis present

## 2023-06-22 DIAGNOSIS — F333 Major depressive disorder, recurrent, severe with psychotic symptoms: Principal | ICD-10-CM | POA: Diagnosis present

## 2023-06-22 DIAGNOSIS — F17210 Nicotine dependence, cigarettes, uncomplicated: Secondary | ICD-10-CM | POA: Insufficient documentation

## 2023-06-22 DIAGNOSIS — Z79899 Other long term (current) drug therapy: Secondary | ICD-10-CM | POA: Insufficient documentation

## 2023-06-22 DIAGNOSIS — F1010 Alcohol abuse, uncomplicated: Secondary | ICD-10-CM | POA: Insufficient documentation

## 2023-06-22 DIAGNOSIS — F1220 Cannabis dependence, uncomplicated: Secondary | ICD-10-CM | POA: Insufficient documentation

## 2023-06-22 DIAGNOSIS — F1090 Alcohol use, unspecified, uncomplicated: Secondary | ICD-10-CM

## 2023-06-22 DIAGNOSIS — F17200 Nicotine dependence, unspecified, uncomplicated: Secondary | ICD-10-CM | POA: Insufficient documentation

## 2023-06-22 DIAGNOSIS — F332 Major depressive disorder, recurrent severe without psychotic features: Principal | ICD-10-CM | POA: Diagnosis present

## 2023-06-22 LAB — LIPID PANEL
Cholesterol: 217 mg/dL — ABNORMAL HIGH (ref 0–200)
HDL: 80 mg/dL (ref >40)
LDL Cholesterol: 129 mg/dL — ABNORMAL HIGH (ref 0–99)
Total CHOL/HDL Ratio: 2.7 ratio
Triglycerides: 41 mg/dL (ref <150)
VLDL: 8 mg/dL (ref 0–40)

## 2023-06-22 LAB — URINALYSIS, ROUTINE W REFLEX MICROSCOPIC
Glucose, UA: NEGATIVE mg/dL
Hgb urine dipstick: NEGATIVE
Ketones, ur: NEGATIVE mg/dL
Leukocytes,Ua: NEGATIVE
Nitrite: NEGATIVE
Protein, ur: 30 mg/dL — AB
Specific Gravity, Urine: >1.03 — ABNORMAL HIGH (ref 1.005–1.030)
pH: 6 (ref 5.0–8.0)

## 2023-06-22 LAB — COMPREHENSIVE METABOLIC PANEL WITH GFR
ALT: 38 U/L (ref 0–44)
AST: 46 U/L — ABNORMAL HIGH (ref 15–41)
Albumin: 4.6 g/dL (ref 3.5–5.0)
Alkaline Phosphatase: 70 U/L (ref 38–126)
Anion gap: 11 (ref 5–15)
BUN: 13 mg/dL (ref 6–20)
CO2: 24 mmol/L (ref 22–32)
Calcium: 10.1 mg/dL (ref 8.9–10.3)
Chloride: 102 mmol/L (ref 98–111)
Creatinine, Ser: 1.14 mg/dL (ref 0.61–1.24)
GFR, Estimated: >60 mL/min (ref >60)
Glucose, Bld: 107 mg/dL — ABNORMAL HIGH (ref 70–99)
Potassium: 3.8 mmol/L (ref 3.5–5.1)
Sodium: 137 mmol/L (ref 135–145)
Total Bilirubin: 2 mg/dL — ABNORMAL HIGH (ref 0.0–1.2)
Total Protein: 7.2 g/dL (ref 6.5–8.1)

## 2023-06-22 LAB — URINALYSIS, MICROSCOPIC (REFLEX)
Bacteria, UA: NONE SEEN
RBC / HPF: NONE SEEN RBC/hpf (ref 0–5)

## 2023-06-22 LAB — HEMOGLOBIN A1C
Hgb A1c MFr Bld: 4.3 % — ABNORMAL LOW (ref 4.8–5.6)
Mean Plasma Glucose: 76.71 mg/dL

## 2023-06-22 LAB — CBC WITH DIFFERENTIAL/PLATELET
Abs Immature Granulocytes: 0.04 10*3/uL (ref 0.00–0.07)
Basophils Absolute: 0 10*3/uL (ref 0.0–0.1)
Basophils Relative: 1 %
Eosinophils Absolute: 0 10*3/uL (ref 0.0–0.5)
Eosinophils Relative: 1 %
HCT: 46.3 % (ref 39.0–52.0)
Hemoglobin: 16.8 g/dL (ref 13.0–17.0)
Immature Granulocytes: 1 %
Lymphocytes Relative: 20 %
Lymphs Abs: 1.7 10*3/uL (ref 0.7–4.0)
MCH: 31.3 pg (ref 26.0–34.0)
MCHC: 36.3 g/dL — ABNORMAL HIGH (ref 30.0–36.0)
MCV: 86.2 fL (ref 80.0–100.0)
Monocytes Absolute: 0.5 10*3/uL (ref 0.1–1.0)
Monocytes Relative: 6 %
Neutro Abs: 6.3 10*3/uL (ref 1.7–7.7)
Neutrophils Relative %: 71 %
Platelets: 248 10*3/uL (ref 150–400)
RBC: 5.37 MIL/uL (ref 4.22–5.81)
RDW: 11.9 % (ref 11.5–15.5)
WBC: 8.6 10*3/uL (ref 4.0–10.5)
nRBC: 0 % (ref 0.0–0.2)

## 2023-06-22 LAB — POCT URINE DRUG SCREEN - MANUAL ENTRY (I-SCREEN)
POC Amphetamine UR: POSITIVE — AB
POC Buprenorphine (BUP): NOT DETECTED
POC Cocaine UR: NOT DETECTED
POC Marijuana UR: POSITIVE — AB
POC Methadone UR: NOT DETECTED
POC Methamphetamine UR: POSITIVE — AB
POC Morphine: NOT DETECTED
POC Oxazepam (BZO): POSITIVE — AB
POC Oxycodone UR: NOT DETECTED
POC Secobarbital (BAR): NOT DETECTED

## 2023-06-22 LAB — TSH: TSH: 2.029 u[IU]/mL (ref 0.350–4.500)

## 2023-06-22 LAB — ETHANOL: Alcohol, Ethyl (B): <10 mg/dL (ref <10)

## 2023-06-22 MED ORDER — LORAZEPAM 1 MG PO TABS: 1.0000 mg | ORAL_TABLET | Freq: Two times a day (BID) | ORAL | Status: DC

## 2023-06-22 MED ORDER — VITAMIN B-1 100 MG PO TABS
100.0000 mg | ORAL_TABLET | Freq: Every day | ORAL | Status: DC
  Administered 2023-06-23 – 2023-06-26 (×4): 100 mg via ORAL
  Filled 2023-06-22 (×7): qty 1

## 2023-06-22 MED ORDER — ALUM & MAG HYDROXIDE-SIMETH 200-200-20 MG/5ML PO SUSP: 30.0000 mL | ORAL | Status: DC | PRN

## 2023-06-22 MED ORDER — HYDROXYZINE HCL 25 MG PO TABS: 25.0000 mg | ORAL_TABLET | Freq: Four times a day (QID) | ORAL | Status: DC | PRN

## 2023-06-22 MED ORDER — ONDANSETRON 4 MG PO TBDP: 4.0000 mg | ORAL_TABLET | Freq: Four times a day (QID) | ORAL | Status: DC | PRN

## 2023-06-22 MED ORDER — ADULT MULTIVITAMIN W/MINERALS CH
1.0000 | ORAL_TABLET | Freq: Every day | ORAL | Status: DC
  Administered 2023-06-22: 1 via ORAL
  Filled 2023-06-22: qty 1

## 2023-06-22 MED ORDER — TRAZODONE HCL 50 MG PO TABS
50.0000 mg | ORAL_TABLET | Freq: Every evening | ORAL | Status: DC | PRN
  Administered 2023-06-22: 50 mg via ORAL
  Filled 2023-06-22: qty 1

## 2023-06-22 MED ORDER — LORAZEPAM 2 MG/ML IJ SOLN
2.0000 mg | Freq: Once | INTRAMUSCULAR | Status: AC
Start: 2023-06-22 — End: 2023-06-22
  Administered 2023-06-22: 2 mg via INTRAMUSCULAR
  Filled 2023-06-22: qty 1

## 2023-06-22 MED ORDER — LOPERAMIDE HCL 2 MG PO CAPS: 2.0000 mg | ORAL_CAPSULE | ORAL | Status: DC | PRN

## 2023-06-22 MED ORDER — LORAZEPAM 1 MG PO TABS: 1.0000 mg | ORAL_TABLET | Freq: Three times a day (TID) | ORAL | Status: DC

## 2023-06-22 MED ORDER — ACETAMINOPHEN 325 MG PO TABS: 650.0000 mg | ORAL_TABLET | Freq: Four times a day (QID) | ORAL | Status: DC | PRN

## 2023-06-22 MED ORDER — TRAZODONE HCL 50 MG PO TABS
50.0000 mg | ORAL_TABLET | Freq: Every evening | ORAL | Status: DC | PRN
  Administered 2023-06-22 – 2023-06-23 (×2): 50 mg via ORAL
  Filled 2023-06-22 (×2): qty 1

## 2023-06-22 MED ORDER — LORAZEPAM 1 MG PO TABS: 1.0000 mg | ORAL_TABLET | Freq: Four times a day (QID) | ORAL | Status: DC

## 2023-06-22 MED ORDER — OLANZAPINE 5 MG PO TBDP: 5.0000 mg | ORAL_TABLET | Freq: Three times a day (TID) | ORAL | Status: DC | PRN

## 2023-06-22 MED ORDER — LORAZEPAM 1 MG PO TABS: 1.0000 mg | ORAL_TABLET | Freq: Four times a day (QID) | ORAL | Status: DC | PRN

## 2023-06-22 MED ORDER — LORAZEPAM 1 MG PO TABS: 1.0000 mg | ORAL_TABLET | Freq: Every day | ORAL | Status: DC

## 2023-06-22 MED ORDER — HALOPERIDOL LACTATE 5 MG/ML IJ SOLN
5.0000 mg | Freq: Three times a day (TID) | INTRAMUSCULAR | Status: DC | PRN
  Administered 2023-06-23: 5 mg via INTRAMUSCULAR
  Filled 2023-06-22: qty 1

## 2023-06-22 MED ORDER — MAGNESIUM HYDROXIDE 400 MG/5ML PO SUSP: 30.0000 mL | Freq: Every day | ORAL | Status: DC | PRN

## 2023-06-22 MED ORDER — LOPERAMIDE HCL 2 MG PO CAPS: 2.0000 mg | ORAL_CAPSULE | ORAL | Status: AC | PRN

## 2023-06-22 MED ORDER — LORAZEPAM 1 MG PO TABS: 1.0000 mg | ORAL_TABLET | Freq: Four times a day (QID) | ORAL | Status: AC | PRN

## 2023-06-22 MED ORDER — THIAMINE MONONITRATE 100 MG PO TABS: 100.0000 mg | ORAL_TABLET | Freq: Every day | ORAL | Status: DC

## 2023-06-22 MED ORDER — DIPHENHYDRAMINE HCL 50 MG/ML IJ SOLN
50.0000 mg | Freq: Three times a day (TID) | INTRAMUSCULAR | Status: DC | PRN
Start: 2023-06-22 — End: 2023-06-26
  Administered 2023-06-23: 50 mg via INTRAMUSCULAR
  Filled 2023-06-22: qty 1

## 2023-06-22 MED ORDER — ADULT MULTIVITAMIN W/MINERALS CH
1.0000 | ORAL_TABLET | Freq: Every day | ORAL | Status: DC
  Administered 2023-06-23 – 2023-06-26 (×4): 1 via ORAL
  Filled 2023-06-22 (×7): qty 1

## 2023-06-22 MED ORDER — LORAZEPAM 1 MG PO TABS
1.0000 mg | ORAL_TABLET | Freq: Four times a day (QID) | ORAL | Status: DC
  Administered 2023-06-22 (×2): 1 mg via ORAL
  Filled 2023-06-22 (×2): qty 1

## 2023-06-22 MED ORDER — THIAMINE HCL 100 MG/ML IJ SOLN
100.0000 mg | Freq: Once | INTRAMUSCULAR | Status: AC
  Administered 2023-06-22: 100 mg via INTRAMUSCULAR
  Filled 2023-06-22: qty 2

## 2023-06-22 MED ORDER — HYDROXYZINE HCL 25 MG PO TABS: 25.0000 mg | ORAL_TABLET | Freq: Three times a day (TID) | ORAL | Status: DC | PRN

## 2023-06-22 MED ORDER — ONDANSETRON 4 MG PO TBDP: 4.0000 mg | ORAL_TABLET | Freq: Four times a day (QID) | ORAL | Status: AC | PRN

## 2023-06-22 MED ORDER — LORAZEPAM 2 MG/ML IJ SOLN
2.0000 mg | Freq: Three times a day (TID) | INTRAMUSCULAR | Status: DC | PRN
  Administered 2023-06-23: 2 mg via INTRAMUSCULAR
  Filled 2023-06-22: qty 1

## 2023-06-22 MED ORDER — HYDROXYZINE HCL 25 MG PO TABS
25.0000 mg | ORAL_TABLET | Freq: Four times a day (QID) | ORAL | Status: AC | PRN
  Administered 2023-06-22 – 2023-06-23 (×2): 25 mg via ORAL
  Filled 2023-06-22: qty 1

## 2023-06-22 MED ORDER — HYDROXYZINE HCL 25 MG PO TABS
25.0000 mg | ORAL_TABLET | Freq: Three times a day (TID) | ORAL | Status: DC | PRN
  Administered 2023-06-24 – 2023-06-25 (×2): 25 mg via ORAL
  Filled 2023-06-22 (×3): qty 1
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
## 2023-06-22 NOTE — ED Notes (Signed)
 Pt alert and orient x 4 denies SI/HI does endorse some hallucinations pt states he is stress from working multiple jobs and taking care of his family no pain at this time will continue to monitor for safety

## 2023-06-22 NOTE — Tx Team (Signed)
 Initial Treatment Plan 06/22/2023 11:39 PM Quantavious Eggert [id number]    PATIENT STRESSORS: Marital or family conflict   Medication change or noncompliance   Substance abuse     PATIENT STRENGTHS: General fund of knowledge  Motivation for treatment/growth  Supportive family/friends    PATIENT IDENTIFIED PROBLEMS: Psychosis  Paranoia  SA (multiple substances , "E-pills")  "Pack and un-pack by myself"               DISCHARGE CRITERIA:  Improved stabilization in mood, thinking, and/or behavior Verbal commitment to aftercare and medication compliance  PRELIMINARY DISCHARGE PLAN: Attend aftercare/continuing care group Attend 12-step recovery group Outpatient therapy  PATIENT/FAMILY INVOLVEMENT: This treatment plan has been presented to and reviewed with the patient, [employer] .  The patient and family have been given the opportunity to ask questions and make suggestions.  Delos Haring, RN 06/22/2023, 11:39 PM

## 2023-06-22 NOTE — Progress Notes (Signed)
 Pt is admitted to [facility] due passive SI with no plan or intent. Pt currently denies and verbally contracts for safety on the unit. Pt was advised to notify staff when having thoughts of hurting self or others. Pt verbalized understanding. Pt is restless and unable to sit still. Pt is ambulatory and is oriented to staff/unit. Pt was cooperative with labs and skin assessment. Pt denies pain and current HI/AVH. Staff will monitor for pt's safety.
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
## 2023-06-22 NOTE — Progress Notes (Signed)
 Pt in his room appearing to respond to internal stimuli, pt can be heard calling peoples names. Pt constantly coming out his room appearing confused and presenting psychotic . Pt appears very paranoid
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
## 2023-06-23 ENCOUNTER — Other Ambulatory Visit: Payer: Self-pay

## 2023-06-23 ENCOUNTER — Encounter (HOSPITAL_COMMUNITY): Payer: Self-pay

## 2023-06-23 DIAGNOSIS — F333 Major depressive disorder, recurrent, severe with psychotic symptoms: Principal | ICD-10-CM

## 2023-06-23 MED ORDER — NICOTINE 14 MG/24HR TD PT24
14.0000 mg | MEDICATED_PATCH | Freq: Every day | TRANSDERMAL | Status: DC
  Administered 2023-06-23 – 2023-06-25 (×3): 14 mg via TRANSDERMAL
  Filled 2023-06-23 (×7): qty 1

## 2023-06-23 MED ORDER — ESCITALOPRAM OXALATE 10 MG PO TABS
10.0000 mg | ORAL_TABLET | Freq: Every day | ORAL | Status: DC
  Administered 2023-06-23 – 2023-06-26 (×4): 10 mg via ORAL
  Filled 2023-06-23 (×7): qty 1

## 2023-06-23 MED ORDER — OLANZAPINE 5 MG PO TABS
5.0000 mg | ORAL_TABLET | Freq: Every day | ORAL | Status: DC
  Administered 2023-06-23: 5 mg via ORAL
  Filled 2023-06-23 (×2): qty 1
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
## 2023-06-23 NOTE — Progress Notes (Signed)
 Dar Note: Patient was in bed sleeping for majority of this shift.  Patient was given evening medication and was asking about treatment plan of care.  Patient alert and oriented to person and place.  Routine safety checks maintained.  Food and fluid intake encouraged.  Patient is safe on the unit.

## 2023-06-23 NOTE — Plan of Care (Signed)
   Problem: Education: Goal: Emotional status will improve Outcome: Progressing Goal: Mental status will improve Outcome: Progressing
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
## 2023-06-23 NOTE — Plan of Care (Signed)
  Problem: Safety: Goal: Periods of time without injury will increase Outcome: Progressing   Problem: Education: Goal: Emotional status will improve Outcome: Not Progressing Goal: Mental status will improve Outcome: Not Progressing   Problem: Activity: Goal: Sleeping patterns will improve Outcome: Not Progressing   Problem: Coping: Goal: Ability to demonstrate self-control will improve Outcome: Not Progressing

## 2023-06-23 NOTE — Group Note (Signed)
 Date:  06/23/2023 Time:  9:25 AM  Group Topic/Focus:  Goals Group:   The focus of this group is to help patients establish daily goals to achieve during treatment and discuss how the patient can incorporate goal setting into their daily lives to aide in recovery. Orientation:   The focus of this group is to educate the patient on the purpose and policies of crisis stabilization and provide a format to answer questions about their admission.  The group details unit policies and expectations of patients while admitted.    Participation Level:  Did Not Attend   Additional Comments:  Did not attend.   [name] 06/23/2023, 9:25 AM

## 2023-06-23 NOTE — Plan of Care (Signed)
   Problem: Education: Goal: Emotional status will improve Outcome: Not Progressing Goal: Mental status will improve Outcome: Not Progressing

## 2023-06-23 NOTE — H&P (Addendum)
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 reasonably be expected to improve the patient's condition.     Norma Fredrickson, NP 4/5/20256:53 AM

## 2023-06-23 NOTE — Progress Notes (Signed)
 Pt stated he was doing "e pills" , so pt believes he was doing ecstasy (mollies). Pt continues to be confused through the night , pt comes out looking for his shoes and phone numerous times , after he was told they have been locked up.

## 2023-06-23 NOTE — Group Note (Signed)
 Recreation Therapy Group Note   Group Topic:Problem Solving  Group Date: 06/23/2023 Start Time: 1015 End Time: 1045 Facilitators: Makeshia Seat-McCall, LRT,CTRS Location: [address] Dayroom   Group Topic: Problem Solving  Goal Area(s) Addresses:  Patient will effectively work in a team with other group members. Patient will verbalize importance of using appropriate problem solving techniques.  Patient will identify positive change associated with effective problem solving skills.   Intervention: Worksheets, Music  Activity: Dentist. Patients were given two worksheets that had 12 brain teasers on each page. Patients could work together to try and Teacher, music presented. Patients were given 20 minutes to complete the worksheets presented. LRT and patients went over the answers once patients finished.      Education: Problem Solving, Decision Making, Team Building  Education Outcome: Acknowledges understanding/In group clarification offered/Needs additional education.   Affect/Mood: N/A   Participation Level: Did not attend    Clinical Observations/Individualized Feedback:      Plan: Continue to engage patient in RT group sessions 2-3x/week.   Kenadie Royce-McCall, LRT,CTRS 06/23/2023 12:44 PM

## 2023-06-23 NOTE — BHH Counselor (Signed)
 Adult Comprehensive Assessment  Patient ID: William Caldwell, male   DOB: 1995-06-12, 28 y.o.   MRN: [id number]  1st ATTEMPT   CSW attempted to complete an assessment, however patient was asleep that afternoon (patient received an agitation protocol that morning).   Alfredia Ferguson Jesica Goheen, LCSWA 06/23/2023

## 2023-06-23 NOTE — Progress Notes (Signed)
 Pt constantly coming out the room needing redirection , pt seeing things in his room, pt stating his cat is under his bed, pt stated he was "looking for his brother"
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
## 2023-06-23 NOTE — Progress Notes (Signed)
   06/23/23 2228  Psych Admission Type (Psych Patients Only)  Admission Status Voluntary  Psychosocial Assessment  Patient Complaints Substance abuse  Eye Contact Fair  Facial Expression Flat  Affect Depressed  Speech Logical/coherent  Interaction Cautious  Motor Activity Fidgety  Appearance/Hygiene Unremarkable  Behavior Characteristics Cooperative  Mood Depressed  Thought Process  Coherency WDL  Content Preoccupation  Delusions Paranoid  Perception WDL  Hallucination None reported or observed  Judgment Impaired  Confusion None  Danger to Self  Current suicidal ideation? Denies  Danger to Others  Danger to Others None reported or observed

## 2023-06-23 NOTE — Progress Notes (Signed)
 Pt received agitation protocol per [facility] due to behaviors on the unit in previous notes by staff

## 2023-06-23 NOTE — BHH Group Notes (Signed)
 Spirituality group facilitated by Kathleen Argue, BCC.  Group Description: Group focused on topic of hope. Patients participated in facilitated discussion around topic, connecting with one another around experiences and definitions for hope. Group members engaged with visual explorer photos, reflecting on what hope looks like for them today. Group engaged in discussion around how their definitions of hope are present today in hospital.  Modalities: Psycho-social ed, Adlerian, Narrative, MI  Patient Progress: Did not attend.

## 2023-06-24 DIAGNOSIS — F332 Major depressive disorder, recurrent severe without psychotic features: Secondary | ICD-10-CM | POA: Diagnosis not present

## 2023-06-24 DIAGNOSIS — F1210 Cannabis abuse, uncomplicated: Secondary | ICD-10-CM | POA: Insufficient documentation

## 2023-06-24 DIAGNOSIS — F19951 Other psychoactive substance use, unspecified with psychoactive substance-induced psychotic disorder with hallucinations: Secondary | ICD-10-CM | POA: Insufficient documentation

## 2023-06-24 DIAGNOSIS — F1220 Cannabis dependence, uncomplicated: Secondary | ICD-10-CM | POA: Insufficient documentation

## 2023-06-24 DIAGNOSIS — F17200 Nicotine dependence, unspecified, uncomplicated: Secondary | ICD-10-CM | POA: Insufficient documentation

## 2023-06-24 MED ORDER — TRAZODONE HCL 50 MG PO TABS
50.0000 mg | ORAL_TABLET | Freq: Every day | ORAL | Status: DC
  Administered 2023-06-24 – 2023-06-25 (×2): 50 mg via ORAL
  Filled 2023-06-24 (×5): qty 1
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
## 2023-06-24 NOTE — BHH Group Notes (Signed)
 Adult Psychoeducational Group Note  Date:  06/24/2023 Time:  9:57 AM  Group Topic/Focus:  Goals Group:   The focus of this group is to help patients establish daily goals to achieve during treatment and discuss how the patient can incorporate goal setting into their daily lives to aide in recovery.  Participation Level:  Active  Participation Quality:  Appropriate  Affect:  Appropriate  Cognitive:  Appropriate  Insight: Appropriate  Engagement in Group:  Engaged  Modes of Intervention:  Discussion  Additional Comments: The patient engaged in group  Octavio Manns 06/24/2023, 9:57 AM

## 2023-06-24 NOTE — Plan of Care (Signed)
  Problem: Education: Goal: Verbalization of understanding the information provided will improve Outcome: Progressing   Problem: Activity: Goal: Interest or engagement in activities will improve Outcome: Progressing   Problem: Education: Goal: Emotional status will improve Outcome: Progressing

## 2023-06-24 NOTE — Progress Notes (Addendum)
 [facility] MD Progress Note  06/24/2023 1:02 PM William Caldwell  MRN:  [id number]  Principal Problem: MDD (major depressive disorder), recurrent severe, without psychosis (HCC) Diagnosis: Principal Problem:   MDD (major depressive disorder), recurrent severe, without psychosis (HCC) Active Problems:   Generalized anxiety disorder   PTSD (post-traumatic stress disorder)   Alcohol use disorder, severe, dependence (HCC)   Tobacco use disorder   Cannabis dependence (HCC)   Substance-induced psychotic disorder with hallucinations (HCC)   Reason for Admission:  William Caldwell is a 28 yo male w/ hx of PTSD, GAD, MDD (admitted on 06/22/2023, total  LOS: 2 days ) presenting to [facility] on 06/22/23 and admitted to [facility] 06/23/23 for SI, VH, and tactile hallucinations. UDS notably positive for marijuana, methamphetamine, and benzodiazepines.   Chart Review from last 24 hours:  The patient's chart was reviewed and nursing notes were reviewed. The patient's case was discussed in multidisciplinary team meeting.   - Overnight events to report per chart review / staff report: no notable overnight events to report - Patient received all scheduled medications - Patient received the following PRN medications: none  Information Obtained Today During Patient Interview: The patient was seen and evaluated on the unit. On assessment today the patient reports doing much better than before. He denies SI/HI/AVH.  He feels much more calm than before.  Reports eating and sleeping well.  When asked what had happened, he states that he was at a party and was using a lot of drugs in conjunction with not sleeping for 1 week.  States he has little recollection of what drugs he took as he was "going with the flow".  He was curious about discharge but was amenable to staying to continue monitoring for any further psychotic symptoms.  Past Psychiatric History: GAD, MDD, PTSD Past Medical History:  Past Medical History:  Diagnosis Date    Anxiety    Herpes      Current Medications: Current Facility-Administered Medications  Medication Dose Route Frequency Provider Last Rate Last Admin   acetaminophen (TYLENOL) tablet 650 mg  650 mg Oral Q6H PRN Nkwenti, Doris, NP       alum & mag hydroxide-simeth (MAALOX/MYLANTA) 200-200-20 MG/5ML suspension 30 mL  30 mL Oral Q4H PRN Nkwenti, Doris, NP       haloperidol lactate (HALDOL) injection 5 mg  5 mg Intramuscular TID PRN Starleen Blue, NP   5 mg at 06/23/23 0244   And   diphenhydrAMINE (BENADRYL) injection 50 mg  50 mg Intramuscular TID PRN Starleen Blue, NP   50 mg at 06/23/23 0244   And   LORazepam (ATIVAN) injection 2 mg  2 mg Intramuscular TID PRN Starleen Blue, NP   2 mg at 06/23/23 0244   escitalopram (LEXAPRO) tablet 10 mg  10 mg Oral Daily Bennett, Christal H, NP   10 mg at 06/24/23 8295   hydrOXYzine (ATARAX) tablet 25 mg  25 mg Oral TID PRN Starleen Blue, NP       hydrOXYzine (ATARAX) tablet 25 mg  25 mg Oral Q6H PRN Starleen Blue, NP   25 mg at 06/23/23 2228   loperamide (IMODIUM) capsule 2-4 mg  2-4 mg Oral PRN Starleen Blue, NP       LORazepam (ATIVAN) tablet 1 mg  1 mg Oral Q6H PRN Nkwenti, Doris, NP       magnesium hydroxide (MILK OF MAGNESIA) suspension 30 mL  30 mL Oral Daily PRN Starleen Blue, NP       multivitamin with  minerals tablet 1 tablet  1 tablet Oral Daily Starleen Blue, NP   1 tablet at 06/24/23 7829   nicotine (NICODERM CQ - dosed in mg/24 hours) patch 14 mg  14 mg Transdermal Daily Sindy Guadeloupe, NP   14 mg at 06/24/23 0816   OLANZapine zydis (ZYPREXA) disintegrating tablet 5 mg  5 mg Oral TID PRN Starleen Blue, NP       ondansetron (ZOFRAN-ODT) disintegrating tablet 4 mg  4 mg Oral Q6H PRN Starleen Blue, NP       thiamine (Vitamin B-1) tablet 100 mg  100 mg Oral Daily Starleen Blue, NP   100 mg at 06/24/23 5621   traZODone (DESYREL) tablet 50 mg  50 mg Oral QHS Park Pope, MD        Lab Results:  Results for orders placed or performed  during the hospital encounter of 06/22/23 (from the past 48 hours)  POCT Urine Drug Screen - (I-Screen)     Status: Abnormal   Collection Time: 06/22/23  5:34 PM  Result Value Ref Range   POC Marijuana UR Positive (A)    POC Oxycodone UR None Detected    POC Methadone UR None Detected    POC Morphine None Detected    POC Methamphetamine UR Positive (A)    POC Cocaine UR None Detected    POC Oxazepam (BZO) Positive (A)    POC Buprenorphine (BUP) None Detected    POC Secobarbital (BAR) None Detected    POC Amphetamine UR Positive (A)   Urinalysis, Routine w reflex microscopic -Urine, Clean Catch     Status: Abnormal   Collection Time: 06/22/23  5:38 PM  Result Value Ref Range   Color, Urine YELLOW YELLOW   APPearance CLEAR CLEAR   Specific Gravity, Urine >1.030 (H) 1.005 - 1.030   pH 6.0 5.0 - 8.0   Glucose, UA NEGATIVE NEGATIVE mg/dL   Hgb urine dipstick NEGATIVE NEGATIVE   Bilirubin Urine SMALL (A) NEGATIVE   Ketones, ur NEGATIVE NEGATIVE mg/dL   Protein, ur 30 (A) NEGATIVE mg/dL   Nitrite NEGATIVE NEGATIVE   Leukocytes,Ua NEGATIVE NEGATIVE    Comment: Performed at [facility], [address], [place], [place] [address]  Urinalysis, Microscopic (reflex)     Status: None   Collection Time: 06/22/23  5:38 PM  Result Value Ref Range   RBC / HPF NONE SEEN 0 - 5 RBC/hpf   WBC, UA 0-5 0 - 5 WBC/hpf   Bacteria, UA NONE SEEN NONE SEEN   Squamous Epithelial / HPF 0-5 0 - 5 /HPF   Mucus PRESENT     Comment: Performed at [facility], [address], [place], [place] [address]    Blood Alcohol level:  Lab Results  Component Value Date   ETH <10 06/22/2023   ETH 118 (H) 03/29/2023    Metabolic Labs: Lab Results  Component Value Date   HGBA1C 4.3 (L) 06/22/2023   MPG 76.71 06/22/2023   MPG 88.19 03/31/2023   No results found for: "PROLACTIN" Lab Results  Component Value Date   CHOL 217 (H) 06/22/2023   TRIG 41 06/22/2023   HDL 80 06/22/2023   CHOLHDL 2.7  06/22/2023   VLDL 8 06/22/2023   LDLCALC 129 (H) 06/22/2023    Physical Findings: AIMS: No  CIWA:    COWS:     Psychiatric Specialty Exam: General Appearance: Appropriate for Environment; Casual   Eye Contact: Good   Speech: Clear and Coherent; Normal Rate  Volume: Normal   Mood: Anxious; Depressed   Affect: Congruent   Thought Content: Logical   Suicidal Thoughts: Suicidal Thoughts: No   Homicidal Thoughts: Homicidal Thoughts: No   Thought Process: Coherent; Goal Directed; Linear   Orientation: Full (Time, Place and Person)     Memory: Immediate Good; Recent Good; Remote Good   Judgment: Fair   Insight: Fair   Concentration: Good   Recall: Good   Fund of Knowledge: Good   Language: Good   Psychomotor Activity: Psychomotor Activity: Normal   Assets: Communication Skills; Desire for Improvement; Physical Health   Sleep: Sleep: Poor    Review of Systems ROS  Vital Signs: Blood pressure 110/78, pulse (!) 116, temperature (!) 97.2 F (36.2 C), temperature source Oral, resp. rate 16, height 5\' 3"  (1.6 m), weight 54.9 kg, SpO2 98%. Body mass index is 21.43 kg/m. Physical Exam  Assets  Assets: Communication Skills; Desire for Improvement; Physical Health   Treatment Plan Summary: Daily contact with patient to assess and evaluate symptoms and progress in treatment and Medication management  Diagnoses / Active Problems: MDD (major depressive disorder), recurrent severe, without psychosis (HCC) Principal Problem:   MDD (major depressive disorder), recurrent severe, without psychosis (HCC) Active Problems:   Generalized anxiety disorder   PTSD (post-traumatic stress disorder)   Alcohol use disorder, severe, dependence (HCC)   Tobacco use disorder   Cannabis dependence (HCC)   Substance-induced psychotic disorder with hallucinations (HCC)   ASSESSMENT: Patient is a 28 year old male with history of general anxiety disorder, PTSD, MDD  presenting from behavioral health urgent care due to acute psychotic features that appear secondary to substance use.  He denies regularly using substances but it is unclear at this time whether this is true.  He was advised to go to residential rehab but he declined at this time reporting that his substance use he does not feel is a problem.  Given patient is no longer experiencing psychosis, we will discontinue olanzapine.  Increasing Lexapro to better address patient's MDD and anxiety.  these diagnoses are provisional diagnoses and subject to change as the patient's clinical picture evolves or new information is revealed, including substances (drugs of abuse, medications), another medical condition, or better explained by another psychiatric diagnosis.  PLAN: Safety and Monitoring:  -- Voluntary admission to inpatient psychiatric unit for safety, stabilization and treatment  -- Daily contact with patient to assess and evaluate symptoms and progress in treatment  -- Patient's case to be discussed in multi-disciplinary team meeting  -- Observation Level : q15 minute checks  -- Vital signs:  q12 hours  -- Precautions: suicide, elopement, and assault  2. 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, recurrent episode, severe without psychotic features #Generalized anxiety disorder #PTSD #Cannabis use disorder #Tobacco use disorder #Alcohol abuse #Methamphetamine abuse # Benzodiazepine abuse -stop Olanzapine -Increase Lexapro to 10 mg daily -Nicotine patch for NRT -Trazodone 50 mg nightly for insomnia -MVI/thiamine   PRN medications for symptomatic management:              -- continue acetaminophen 650 mg every 6 hours as needed for mild to moderate pain, fever, and headaches              -- continue hydroxyzine 25 mg three times a day as needed for anxiety              -- continue bismuth subsalicylate 524 mg oral  chewable tablet every 3 hours as needed for  indigestion              -- continue senna 8.6 mg oral at bedtime as needed and polyethylene glycol 17 g oral daily as needed for mild to moderate constipation              -- continue ondansetron 8 mg every 8 hours as needed for nausea or vomiting              -- continue aluminum-magnesium hydroxide + simethicone 30 mL every 4 hours as needed for heartburn  -- As needed agitation protocol in-place  The risks/benefits/side-effects/alternatives to the above medication were discussed in detail with the patient and time was given for questions. The patient consents to medication trial. FDA black box warnings, if present, were discussed.  The patient is agreeable with the medication plan, as above. We will monitor the patient's response to pharmacologic treatment, and adjust medications as necessary.  3. Routine and other pertinent labs:             -- Metabolic profile:  BMI: Body mass index is 21.43 kg/m.   Lipid Panel: Lab Results  Component Value Date   CHOL 217 (H) 06/22/2023   TRIG 41 06/22/2023   HDL 80 06/22/2023   CHOLHDL 2.7 06/22/2023   VLDL 8 06/22/2023   LDLCALC 129 (H) 06/22/2023    HbgA1c: Hgb A1c MFr Bld (%)  Date Value  06/22/2023 4.3 (L)    TSH: TSH (uIU/mL)  Date Value  06/22/2023 2.029    EKG monitoring: QTc: 424  4. Group Therapy:  -- Encouraged patient to participate in unit milieu and in scheduled group therapies   -- Short Term Goals: Ability to identify changes in lifestyle to reduce recurrence of condition, verbalize feelings, identify and develop effective coping behaviors, maintain clinical measurements within normal limits, and identify triggers associated with substance abuse/mental health issues will improve. Improvement in ability to demonstrate self-control and comply with prescribed medications.  -- Long Term Goals: Improvement in symptoms so as ready for discharge -- Patient is encouraged to  participate in group therapy while admitted to the psychiatric unit. -- We will address other chronic and acute stressors, which contributed to the patient's MDD (major depressive disorder), recurrent severe, without psychosis (HCC) in order to reduce the risk of self-harm at discharge.  5. Discharge Planning:   -- Social work and case management to assist with discharge planning and identification of hospital follow-up needs prior to discharge  -- Estimated LOS: 5 days  -- Discharge Concerns: Need to establish a safety plan; Medication compliance and effectiveness  -- Discharge Goals: Return home with outpatient referrals for mental health follow-up including medication management/psychotherapy  I certify that inpatient services furnished can reasonably be expected to improve the patient's condition.   Total Time Spent in Direct Patient Care:  I personally spent 35 minutes on the unit in direct patient care. The direct patient care time included face-to-face time with the patient, reviewing the patient's chart, communicating with other professionals, and coordinating care. Greater than 50% of this time was spent in counseling or coordinating care with the patient regarding goals of hospitalization, psycho-education, and discharge planning needs.  Signed: Park Pope, MD 06/24/2023, 1:02 PM

## 2023-06-24 NOTE — Group Note (Signed)
 LCSW Group Therapy Note  Group Date: 06/24/2023 Start Time: 1000 End Time: 1100   Type of Therapy and Topic:  Group Therapy: Positive Affirmations  Participation Level:  Did Not Attend   Description of Group:   This group addressed positive affirmation towards self and others.  Patients went around the room and identified two positive things about themselves and two positive things about a peer in the room.  Patients reflected on how it felt to share something positive with others, to identify positive things about themselves, and to hear positive things from others/ Patients were encouraged to have a daily reflection of positive characteristics or circumstances.   Therapeutic Goals: Patients will verbalize two of their positive qualities Patients will demonstrate empathy for others by stating two positive qualities about a peer in the group Patients will verbalize their feelings when voicing positive self affirmations and when voicing positive affirmations of others Patients will discuss the potential positive impact on their wellness/recovery of focusing on positive traits of self and others.  Summary of Patient Progress:  Pt was invited but did not attend  Therapeutic Modalities:   Cognitive Behavioral Therapy Motivational Interviewing    Steffanie Dunn, LCSWA 06/24/2023  6:00 PM

## 2023-06-24 NOTE — Plan of Care (Signed)
   Problem: Education: Goal: Emotional status will improve Outcome: Progressing Goal: Mental status will improve Outcome: Progressing   Problem: Activity: Goal: Interest or engagement in activities will improve Outcome: Progressing Goal: Sleeping patterns will improve Outcome: Progressing   Problem: Safety: Goal: Periods of time without injury will increase Outcome: Progressing

## 2023-06-24 NOTE — Progress Notes (Signed)
 During this shift, administered PRN Hydroxyzine and Trazodone per [facility] per patient request.

## 2023-06-25 DIAGNOSIS — F332 Major depressive disorder, recurrent severe without psychotic features: Secondary | ICD-10-CM | POA: Diagnosis not present

## 2023-06-25 MED ORDER — ESCITALOPRAM OXALATE 10 MG PO TABS: 10.0000 mg | ORAL_TABLET | Freq: Every day | ORAL | 0 refills | Status: DC

## 2023-06-25 MED ORDER — TRAZODONE HCL 50 MG PO TABS: 50.0000 mg | ORAL_TABLET | Freq: Every day | ORAL | 0 refills | Status: DC

## 2023-06-25 MED ORDER — NICOTINE 14 MG/24HR TD PT24: 14.0000 mg | MEDICATED_PATCH | Freq: Every day | TRANSDERMAL | 0 refills | Status: DC

## 2023-06-25 NOTE — Progress Notes (Signed)
 Patient signed 72 hour on 06/25/2023 at 0700.

## 2023-06-25 NOTE — Plan of Care (Signed)
 Nurse discussed anxiety, depression and coping skills with patient.
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
## 2023-06-25 NOTE — Progress Notes (Signed)
 Adult Psychoeducational Group Note  Date:  06/25/2023 Time:  9:32 PM  Group Topic/Focus:  Wrap-Up Group:   The focus of this group is to help patients review their daily goal of treatment and discuss progress on daily workbooks.  Participation Level:  Active  Participation Quality:  Appropriate  Affect:  Anxious  Cognitive:  Alert  Insight: Appropriate  Engagement in Group:  Engaged  Modes of Intervention:  Discussion  Additional Comments:  William Caldwell want to work on anxiety and anxious  William Caldwell 06/25/2023, 9:32 PM

## 2023-06-25 NOTE — Group Note (Signed)
 Date:  06/25/2023 Time:  9:52 AM  Group Topic/Focus:  The topic for this group is mindful breathing    Participation Level:  Did Not Attend

## 2023-06-25 NOTE — Progress Notes (Signed)
 [facility] MD Progress Note  06/25/2023 3:32 PM William Caldwell  MRN:  [id number]  Principal Problem: MDD (major depressive disorder), recurrent severe, without psychosis (HCC) Diagnosis: Principal Problem:   MDD (major depressive disorder), recurrent severe, without psychosis (HCC) Active Problems:   Generalized anxiety disorder   PTSD (post-traumatic stress disorder)   Alcohol use disorder, severe, dependence (HCC)   Tobacco use disorder   Cannabis dependence (HCC)   Substance-induced psychotic disorder with hallucinations (HCC)   Reason for Admission:  William Caldwell is a 28 yo male w/ hx of PTSD, GAD, MDD (admitted on 06/22/2023, total  LOS: 3 days ) presenting to [facility] on 06/22/23 and admitted to [facility] 06/23/23 for SI, VH, and tactile hallucinations. UDS notably positive for marijuana, methamphetamine, and benzodiazepines.   Chart Review from last 24 hours:  The patient's chart was reviewed and nursing notes were reviewed. The patient's case was discussed in multidisciplinary team meeting.   - Overnight events to report per chart review / staff report: no notable overnight events to report - Patient received all scheduled medications - Patient received the following PRN medications: none  Information Obtained Today During Patient Interview: The patient was seen and evaluated on the unit. On assessment today the patient reports continuing to do well. He denies SI/HI/AVH.  He feels much calmer than before.  Reports eating and sleeping well. He's anxious about going leaving hospital because he has a job and needs to return to work.   Patient psychotic symptoms and insomnia only occurred after stimulant use. He denies psychotic symptoms prior to substance use. He did endorse feeling depressed and anxious and does feel the Lexapro is helping him with this part.  He was amenable to me reaching out to his mother to establish that he has indeed returned to his baseline.  Collateral  William Caldwell (mother) @  [phone] Mom states that patient appears to be doing much better than before.  Patient has spoken with mother several times since being admitted and he seems to be back to baseline.  Patient's mother believes that patient needed more rest and was regularly overworking himself which resulted in his mood symptoms and also substance use.  Mother reports no acute safety concerns and feels comfortable with patient returning home.  Past Psychiatric History: GAD, MDD, PTSD Past Medical History:  Past Medical History:  Diagnosis Date   Anxiety    Herpes      Current Medications: Current Facility-Administered Medications  Medication Dose Route Frequency Provider Last Rate Last Admin   acetaminophen (TYLENOL) tablet 650 mg  650 mg Oral Q6H PRN Nkwenti, Doris, NP       alum & mag hydroxide-simeth (MAALOX/MYLANTA) 200-200-20 MG/5ML suspension 30 mL  30 mL Oral Q4H PRN Nkwenti, Doris, NP       haloperidol lactate (HALDOL) injection 5 mg  5 mg Intramuscular TID PRN Starleen Blue, NP   5 mg at 06/23/23 0244   And   diphenhydrAMINE (BENADRYL) injection 50 mg  50 mg Intramuscular TID PRN Starleen Blue, NP   50 mg at 06/23/23 0244   And   LORazepam (ATIVAN) injection 2 mg  2 mg Intramuscular TID PRN Starleen Blue, NP   2 mg at 06/23/23 0244   escitalopram (LEXAPRO) tablet 10 mg  10 mg Oral Daily Bennett, Christal H, NP   10 mg at 06/25/23 0740   hydrOXYzine (ATARAX) tablet 25 mg  25 mg Oral TID PRN Starleen Blue, NP   25 mg at 06/24/23 2125  hydrOXYzine (ATARAX) tablet 25 mg  25 mg Oral Q6H PRN Starleen Blue, NP   25 mg at 06/23/23 2228   loperamide (IMODIUM) capsule 2-4 mg  2-4 mg Oral PRN Starleen Blue, NP       LORazepam (ATIVAN) tablet 1 mg  1 mg Oral Q6H PRN Starleen Blue, NP       magnesium hydroxide (MILK OF MAGNESIA) suspension 30 mL  30 mL Oral Daily PRN Starleen Blue, NP       multivitamin with minerals tablet 1 tablet  1 tablet Oral Daily Starleen Blue, NP   1 tablet at 06/25/23  0740   nicotine (NICODERM CQ - dosed in mg/24 hours) patch 14 mg  14 mg Transdermal Daily Sindy Guadeloupe, NP   14 mg at 06/25/23 0740   OLANZapine zydis (ZYPREXA) disintegrating tablet 5 mg  5 mg Oral TID PRN Starleen Blue, NP       ondansetron (ZOFRAN-ODT) disintegrating tablet 4 mg  4 mg Oral Q6H PRN Starleen Blue, NP       thiamine (Vitamin B-1) tablet 100 mg  100 mg Oral Daily Nkwenti, Doris, NP   100 mg at 06/25/23 0740   traZODone (DESYREL) tablet 50 mg  50 mg Oral Seabron Spates, MD   50 mg at 06/24/23 2125    Lab Results:  No results found for this or any previous visit (from the past 48 hours).   Blood Alcohol level:  Lab Results  Component Value Date   ETH <10 06/22/2023   ETH 118 (H) 03/29/2023    Metabolic Labs: Lab Results  Component Value Date   HGBA1C 4.3 (L) 06/22/2023   MPG 76.71 06/22/2023   MPG 88.19 03/31/2023   No results found for: "PROLACTIN" Lab Results  Component Value Date   CHOL 217 (H) 06/22/2023   TRIG 41 06/22/2023   HDL 80 06/22/2023   CHOLHDL 2.7 06/22/2023   VLDL 8 06/22/2023   LDLCALC 129 (H) 06/22/2023    Physical Findings: AIMS: No  CIWA:    COWS:     Psychiatric Specialty Exam: General Appearance: Appropriate for Environment; Casual   Eye Contact: Good   Speech: Clear and Coherent; Normal Rate   Volume: Normal   Mood: Anxious; Depressed   Affect: Congruent   Thought Content: Logical   Suicidal Thoughts: Suicidal Thoughts: No   Homicidal Thoughts: Homicidal Thoughts: No   Thought Process: Coherent; Goal Directed; Linear   Orientation: Full (Time, Place and Person)     Memory: Immediate Good; Recent Good; Remote Good   Judgment: Fair   Insight: Fair   Concentration: Good   Recall: Good   Fund of Knowledge: Good   Language: Good   Psychomotor Activity: Psychomotor Activity: Normal   Assets: Communication Skills; Desire for Improvement; Physical Health   Sleep: Sleep: Poor    Review of  Systems ROS  Vital Signs: Blood pressure (!) 131/92, pulse 99, temperature 98.5 F (36.9 C), temperature source Oral, resp. rate 16, height 5\' 3"  (1.6 m), weight 54.9 kg, SpO2 99%. Body mass index is 21.43 kg/m. Physical Exam  Assets  Assets: Communication Skills; Desire for Improvement; Physical Health   Treatment Plan Summary: Daily contact with patient to assess and evaluate symptoms and progress in treatment and Medication management  Diagnoses / Active Problems: MDD (major depressive disorder), recurrent severe, without psychosis (HCC) Principal Problem:   MDD (major depressive disorder), recurrent severe, without psychosis (HCC) Active Problems:   Generalized anxiety disorder   PTSD (post-traumatic  stress disorder)   Alcohol use disorder, severe, dependence (HCC)   Tobacco use disorder   Cannabis dependence (HCC)   Substance-induced psychotic disorder with hallucinations (HCC)   ASSESSMENT: Patient is a 28 year old male with history of general anxiety disorder, PTSD, MDD presenting from behavioral health urgent care due to acute psychotic features that appear secondary to substance use. He denies regularly using substances but it is unclear the frequency. It is clear patient is at risk of psychosis with substance use so strongly advised him to abstain from all substance use.  He was advised to go to residential rehab but he declined at this time reporting that his substance use he does not feel is a problem.  Given patient is no longer experiencing psychosis, we have discontinued olanzapine and he's no longer experiencing any symptoms of psychosis.   PLAN: Safety and Monitoring:  -- Voluntary admission to inpatient psychiatric unit for safety, stabilization and treatment  -- Daily contact with patient to assess and evaluate symptoms and progress in treatment  -- Patient's case to be discussed in multi-disciplinary team meeting  -- Observation Level : q15 minute checks  --  Vital signs:  q12 hours  -- Precautions: suicide, elopement, and assault  2. 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, recurrent episode, severe without psychotic features #Generalized anxiety disorder #PTSD #Cannabis use disorder #Tobacco use disorder #Alcohol abuse #Methamphetamine abuse # Benzodiazepine abuse -stop Olanzapine -Continue Lexapro 10 mg daily -Nicotine patch for NRT -Trazodone 50 mg nightly for insomnia -MVI/thiamine   PRN medications for symptomatic management:              -- continue acetaminophen 650 mg every 6 hours as needed for mild to moderate pain, fever, and headaches              -- continue hydroxyzine 25 mg three times a day as needed for anxiety              -- continue bismuth subsalicylate 524 mg oral chewable tablet every 3 hours as needed for indigestion              -- continue senna 8.6 mg oral at bedtime as needed and polyethylene glycol 17 g oral daily as needed for mild to moderate constipation              -- continue ondansetron 8 mg every 8 hours as needed for nausea or vomiting              -- continue aluminum-magnesium hydroxide + simethicone 30 mL every 4 hours as needed for heartburn  -- As needed agitation protocol in-place  The risks/benefits/side-effects/alternatives to the above medication were discussed in detail with the patient and time was given for questions. The patient consents to medication trial. FDA black box warnings, if present, were discussed.  The patient is agreeable with the medication plan, as above. We will monitor the patient's response to pharmacologic treatment, and adjust medications as necessary.  3. Routine and other pertinent labs:             -- Metabolic profile:  BMI: Body mass index is 21.43 kg/m.   Lipid Panel: Lab Results  Component Value Date   CHOL 217 (H) 06/22/2023   TRIG 41 06/22/2023   HDL 80 06/22/2023    CHOLHDL 2.7 06/22/2023   VLDL 8 06/22/2023   LDLCALC 129 (H) 06/22/2023    HbgA1c: Hgb A1c MFr Bld (%)  Date Value  06/22/2023 4.3 (L)    TSH: TSH (uIU/mL)  Date Value  06/22/2023 2.029    EKG monitoring: QTc: 424  4. Group Therapy:  -- Encouraged patient to participate in unit milieu and in scheduled group therapies   -- Short Term Goals: Ability to identify changes in lifestyle to reduce recurrence of condition, verbalize feelings, identify and develop effective coping behaviors, maintain clinical measurements within normal limits, and identify triggers associated with substance abuse/mental health issues will improve. Improvement in ability to demonstrate self-control and comply with prescribed medications.  -- Long Term Goals: Improvement in symptoms so as ready for discharge -- Patient is encouraged to participate in group therapy while admitted to the psychiatric unit. -- We will address other chronic and acute stressors, which contributed to the patient's MDD (major depressive disorder), recurrent severe, without psychosis (HCC) in order to reduce the risk of self-harm at discharge.  5. Discharge Planning:   -- Social work and case management to assist with discharge planning and identification of hospital follow-up needs prior to discharge  -- Estimated LOS: 5 days  -- Discharge Concerns: Need to establish a safety plan; Medication compliance and effectiveness  -- Discharge Goals: Return home with outpatient referrals for mental health follow-up including medication management/psychotherapy  I certify that inpatient services furnished can reasonably be expected to improve the patient's condition.   Total Time Spent in Direct Patient Care:  I personally spent 35 minutes on the unit in direct patient care. The direct patient care time included face-to-face time with the patient, reviewing the patient's chart, communicating with other professionals, and coordinating care.  Greater than 50% of this time was spent in counseling or coordinating care with the patient regarding goals of hospitalization, psycho-education, and discharge planning needs.  Signed: Park Pope, MD 06/25/2023, 3:32 PM

## 2023-06-25 NOTE — Progress Notes (Signed)
   06/24/23 2100  Psych Admission Type (Psych Patients Only)  Admission Status Voluntary  Psychosocial Assessment  Patient Complaints Substance abuse  Eye Contact Fair  Facial Expression Flat  Affect Appropriate to circumstance  Speech Logical/coherent  Interaction Assertive  Motor Activity Fidgety  Appearance/Hygiene Unremarkable  Behavior Characteristics Cooperative  Mood Pleasant  Thought Process  Coherency WDL  Content WDL  Delusions None reported or observed  Perception WDL  Hallucination None reported or observed  Judgment Impaired  Confusion None  Danger to Self  Current suicidal ideation? Denies  Self-Injurious Behavior No self-injurious ideation or behavior indicators observed or expressed   Agreement Not to Harm Self Yes  Description of Agreement Verbal  Danger to Others  Danger to Others None reported or observed

## 2023-06-25 NOTE — Plan of Care (Signed)
  Problem: Education: Goal: Emotional status will improve Outcome: Progressing Goal: Mental status will improve Outcome: Progressing   Problem: Activity: Goal: Interest or engagement in activities will improve Outcome: Progressing Goal: Sleeping patterns will improve Outcome: Progressing   Problem: Safety: Goal: Periods of time without injury will increase Outcome: Progressing   Problem: Activity: Goal: Interest or engagement in activities will improve Outcome: Progressing Goal: Sleeping patterns will improve Outcome: Progressing

## 2023-06-25 NOTE — Progress Notes (Signed)
   06/25/23 2000  Psych Admission Type (Psych Patients Only)  Admission Status Voluntary/72 hour document signed  Psychosocial Assessment  Patient Complaints None  Eye Contact Fair  Facial Expression Flat  Affect Appropriate to circumstance  Speech Logical/coherent  Interaction Assertive  Motor Activity Fidgety  Appearance/Hygiene Unremarkable  Behavior Characteristics Cooperative;Appropriate to situation  Mood Pleasant  Thought Process  Coherency WDL  Content WDL  Delusions None reported or observed  Perception WDL  Hallucination None reported or observed  Judgment Impaired  Confusion None  Danger to Self  Current suicidal ideation? Denies  Self-Injurious Behavior No self-injurious ideation or behavior indicators observed or expressed   Agreement Not to Harm Self Yes  Description of Agreement Verbal  Danger to Others  Danger to Others None reported or observed

## 2023-06-25 NOTE — Group Note (Signed)
 Date:  06/25/2023 Time:  9:46 AM  Group Topic/Focus:  Goals Group:   The focus of this group is to help patients establish daily goals to achieve during treatment and discuss how the patient can incorporate goal setting into their daily lives to aide in recovery. Orientation:   The focus of this group is to educate the patient on the purpose and policies of crisis stabilization and provide a format to answer questions about their admission.  The group details unit policies and expectations of patients while admitted.    Participation Level:  Did Not Attend  Participation Quality:  n/a  Affect:  n/a  Cognitive:  n/a  Insight: None  Engagement in Group:  n/a  Modes of Intervention:  n/a  Additional Comments:  n/a  William Caldwell Course 06/25/2023, 9:46 AM

## 2023-06-25 NOTE — [facility] Suicide Risk Assessment (Incomplete)
 [facility] Discharge Suicide Risk Assessment   Principal Problem: MDD (major depressive disorder), recurrent severe, without psychosis (HCC) Discharge Diagnoses: Principal Problem:   MDD (major depressive disorder), recurrent severe, without psychosis (HCC) Active Problems:   Generalized anxiety disorder   PTSD (post-traumatic stress disorder)   Alcohol use disorder, severe, dependence (HCC)   Tobacco use disorder   Cannabis dependence (HCC)   Substance-induced psychotic disorder with hallucinations (HCC)   Reason for Admission: William Caldwell is a 28 yo male w/ hx of PTSD, GAD, MDD (admitted on 06/22/2023, total  LOS: 2 days ) presenting to [facility] on 06/22/23 and admitted to [facility] 06/23/23 for SI, VH, and tactile hallucinations. UDS notably positive for marijuana, methamphetamine, and benzodiazepines.   Hospital Course  During the patient's hospitalization, patient had extensive initial psychiatric evaluation, and follow-up psychiatric evaluations every day.  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, recurrent episode, severe without psychotic features #Generalized anxiety disorder #PTSD #Cannabis use disorder #Tobacco use disorder #Alcohol abuse #Methamphetamine abuse # Benzodiazepine abuse  Patient's psychiatric medications were adjusted on admission:  -resume lexapro 10 mg daily -start zyprexa 5 mg at bedtime for psychosis  During the hospitalization, other adjustments were made to the patient's psychiatric medication regimen:  -Discontinued olanzapine as patient was no longer experiencing psychosis -started trazodone 50 mg nightly to aid with insomnia  Patient's care was discussed during the interdisciplinary team meeting every day during the hospitalization.  The patient denies having side effects to prescribed psychiatric medication.  Gradually, patient started adjusting to milieu. The patient was  evaluated each day by a clinical provider to ascertain response to treatment. Improvement was noted by the patient's report of decreasing symptoms, improved sleep and appetite, affect, medication tolerance, behavior, and participation in unit programming.  Patient was asked each day to complete a self inventory noting mood, mental status, pain, new symptoms, anxiety and concerns.    Symptoms were reported as significantly decreased or resolved completely by discharge.   On day of discharge, the patient reports that their mood is stable. The patient denied having suicidal thoughts for more than 48 hours prior to discharge.  Patient denies having homicidal thoughts.  Patient denies having auditory hallucinations.  Patient denies any visual hallucinations or other symptoms of psychosis. The patient was motivated to continue taking medication with a goal of continued improvement in mental health.   The patient reports their target psychiatric symptoms of psychosis responded well to the psychiatric medications, and the patient reports overall benefit other psychiatric hospitalization. Supportive psychotherapy was provided to the patient. The patient also participated in regular group therapy while hospitalized. Coping skills, problem solving as well as relaxation therapies were also part of the unit programming.  Labs were reviewed with the patient, and abnormal results were discussed with the patient.  The patient is able to verbalize their individual safety plan to this provider.  # It is recommended to the patient to continue psychiatric medications as prescribed, after discharge from the hospital.    # It is recommended to the patient to follow up with your outpatient psychiatric provider and PCP.  # It was discussed with the patient, the impact of alcohol, drugs, tobacco have been there overall psychiatric and medical wellbeing, and total abstinence from substance use was recommended the  patient.ed.  # Prescriptions provided or sent directly to preferred pharmacy at discharge. Patient agreeable to plan. Given opportunity to ask questions. Appears to feel comfortable with discharge.    #  In the event of worsening symptoms, the patient is instructed to call the crisis hotline, 911 and or go to the nearest ED for appropriate evaluation and treatment of symptoms. To follow-up with primary care provider for other medical issues, concerns and or health care needs  # Patient was discharged home with a plan to follow up as noted below.   Total Time spent with patient: 45 minutes  Musculoskeletal: Strength & Muscle Tone: within normal limits Gait & Station: normal Patient leans: N/A  Psychiatric Specialty Exam  Presentation  General Appearance: Appropriate for Environment; Casual   Eye Contact:Good   Speech:Clear and Coherent; Normal Rate   Speech Volume:Normal   Handedness:Right    Mood and Affect  Mood:Anxious   Duration of Depression Symptoms: Greater than two weeks   Affect:Appropriate; Congruent    Thought Process  Thought Processes:Coherent; Goal Directed; Linear   Descriptions of Associations:Intact   Orientation:Full (Time, Place and Person)   Thought Content:Logical   History of Schizophrenia/Schizoaffective disorder:No   Duration of Psychotic Symptoms:No data recorded  Hallucinations:Hallucinations: None  Ideas of Reference:None   Suicidal Thoughts:Suicidal Thoughts: No  Homicidal Thoughts:Homicidal Thoughts: No   Sensorium  Memory:Immediate Good; Recent Good; Remote Good   Judgment:Fair   Insight:Fair    Executive Functions  Concentration:Good   Attention Span:Good   Recall:Good   Fund of Knowledge:Good   Language:Good    Psychomotor Activity  Psychomotor Activity:Psychomotor Activity: Normal   Assets  Assets:Communication Skills; Desire for Improvement; Physical Health    Sleep   Sleep:Sleep: Fair   Physical Exam: Physical Exam ROS Blood pressure (!) 131/92, pulse 99, temperature 98.5 F (36.9 C), temperature source Oral, resp. rate 16, height 5\' 3"  (1.6 m), weight 54.9 kg, SpO2 99%. Body mass index is 21.43 kg/m.  Mental Status Per Nursing Assessment::   On Admission:  Suicidal ideation indicated by patient  Demographic Factors:  Male, Adolescent or young adult, and Low socioeconomic status  Loss Factors: Financial problems/change in socioeconomic status  Historical Factors: Impulsivity  Risk Reduction Factors:   Living with another person, especially a relative, Positive social support, Positive therapeutic relationship, and Positive coping skills or problem solving skills  Continued Clinical Symptoms:  Severe Anxiety and/or Agitation Depression:   Impulsivity More than one psychiatric diagnosis Previous Psychiatric Diagnoses and Treatments  Cognitive Features That Contribute To Risk:  None    Suicide Risk:  Minimal: No identifiable suicidal ideation.  Patients presenting with no risk factors but with morbid ruminations; may be classified as minimal risk based on the severity of the depressive symptoms   Follow-up Information     Services, [facility] Follow up.   Why: Referral made Contact information: [address] [place] [address] [phone]         [facility]. Go on 07/04/2023.   Specialty: Behavioral Health Why: Please go to this provider on 07/04/23 at 7:00 am for an assessment, to obtain medication management and therapy services. Contact information: [address] [place] [address] [phone]                Plan Of Care/Follow-up recommendations:  Activity: as tolerated  Diet: heart healthy  Other: -Follow-up with your outpatient psychiatric provider -instructions on appointment date, time, and address (location) are provided to you in discharge  paperwork.  -Take your psychiatric medications as prescribed at discharge - instructions are provided to you in the discharge paperwork  -Testing: Follow-up with outpatient provider for abnormal  lab results: Elevated LDL cholesterol 129  -Recommend abstinence from alcohol, tobacco, and other illicit drug use at discharge.   -If your psychiatric symptoms recur, worsen, or if you have side effects to your psychiatric medications, call your outpatient psychiatric provider, 911, [phone] or go to the nearest emergency department.  -If suicidal thoughts recur, call your outpatient psychiatric provider, 911, [phone] or go to the nearest emergency department.   Park Pope, MD 06/25/2023, 3:49 PM

## 2023-06-25 NOTE — Progress Notes (Signed)
 D:  Patient's self inventory sheet, patient sleeps good, no sleep medication.  Good appetite, high energy level, good concentration.  Denied depression, hopeless, anxiety.  Denied withdrawals.  Denied SI.  Denied physical problems.  Denied physical pain.  No pain medicine needed.  Goal is discharge home to responsibilities.  Plans to stay positive.  Rent was due yesterday, Saturday. A:  Medications administered per MD orders.  Emotional support and encouragement given patient. R:  Denied SI and HI, contracts for safety.  Denied A/V hallucinations.  Safety maintained with 15 minute checks. "I feel normal today".

## 2023-06-25 NOTE — Hospital Course (Signed)
 Reason for Admission: William Caldwell is a 28 yo male w/ hx of PTSD, GAD, MDD (admitted on 06/22/2023, total  LOS: 2 days ) presenting to [facility] on 06/22/23 and admitted to [facility] 06/23/23 for SI, VH, and tactile hallucinations. UDS notably positive for marijuana, methamphetamine, and benzodiazepines.   Hospital Course  During the patient's hospitalization, patient had extensive initial psychiatric evaluation, and follow-up psychiatric evaluations every day.  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, recurrent episode, severe without psychotic features #Generalized anxiety disorder #PTSD #Cannabis use disorder #Tobacco use disorder #Alcohol abuse #Methamphetamine abuse # Benzodiazepine abuse  Patient's psychiatric medications were adjusted on admission:  -resume lexapro 10 mg daily -start zyprexa 5 mg at bedtime for psychosis  During the hospitalization, other adjustments were made to the patient's psychiatric medication regimen:  -Discontinued olanzapine as patient was no longer experiencing psychosis -started trazodone 50 mg nightly to aid with insomnia  Patient's care was discussed during the interdisciplinary team meeting every day during the hospitalization.  The patient denies having side effects to prescribed psychiatric medication.  Gradually, patient started adjusting to milieu. The patient was evaluated each day by a clinical provider to ascertain response to treatment. Improvement was noted by the patient's report of decreasing symptoms, improved sleep and appetite, affect, medication tolerance, behavior, and participation in unit programming.  Patient was asked each day to complete a self inventory noting mood, mental status, pain, new symptoms, anxiety and concerns.    Symptoms were reported as significantly decreased or resolved completely by discharge.   On day of discharge, the patient reports that  their mood is stable. The patient denied having suicidal thoughts for more than 48 hours prior to discharge.  Patient denies having homicidal thoughts.  Patient denies having auditory hallucinations.  Patient denies any visual hallucinations or other symptoms of psychosis. The patient was motivated to continue taking medication with a goal of continued improvement in mental health.   The patient reports their target psychiatric symptoms of psychosis responded well to the psychiatric medications, and the patient reports overall benefit other psychiatric hospitalization. Supportive psychotherapy was provided to the patient. The patient also participated in regular group therapy while hospitalized. Coping skills, problem solving as well as relaxation therapies were also part of the unit programming.  Labs were reviewed with the patient, and abnormal results were discussed with the patient.  The patient is able to verbalize their individual safety plan to this provider.  # It is recommended to the patient to continue psychiatric medications as prescribed, after discharge from the hospital.    # It is recommended to the patient to follow up with your outpatient psychiatric provider and PCP.  # It was discussed with the patient, the impact of alcohol, drugs, tobacco have been there overall psychiatric and medical wellbeing, and total abstinence from substance use was recommended the patient.ed.  # Prescriptions provided or sent directly to preferred pharmacy at discharge. Patient agreeable to plan. Given opportunity to ask questions. Appears to feel comfortable with discharge.    # In the event of worsening symptoms, the patient is instructed to call the crisis hotline, 911 and or go to the nearest ED for appropriate evaluation and treatment of symptoms. To follow-up with primary care provider for other medical issues, concerns and or health care needs  # Patient was discharged home with a plan to follow  up as noted below.

## 2023-06-26 DIAGNOSIS — F332 Major depressive disorder, recurrent severe without psychotic features: Secondary | ICD-10-CM | POA: Diagnosis not present

## 2023-06-26 NOTE — Progress Notes (Signed)
 I assumed care for William Caldwell at about 07:30. He was resting in his bed, alert, denied any new pain, denied any avh/hi/si, has been med compliant, reported looking forward to discharge home. Discharge instructions reviewed with patient, he verbalized understanding.  Personal belongings handed over to patient prior to discharge. He left the facility at about 12:30, fully ambulatory, in no apparent distress

## 2023-06-26 NOTE — [facility] Suicide Risk Assessment (Signed)
 [facility] INPATIENT:  Family/Significant Other Suicide Prevention Education  Suicide Prevention Education:  Education Completed; Hydrologist,  (self) has been identified by the patient as the family member/significant other with whom the patient will be residing, and identified as the person(s) who will aid the patient in the event of a mental health crisis (suicidal ideations/suicide attempt).  With written consent from the patient, the family member/significant other has been provided the following suicide prevention education, prior to the and/or following the discharge of the patient.  The suicide prevention education provided includes the following: Suicide risk factors Suicide prevention and interventions National Suicide Hotline telephone number [facility] assessment telephone number [facility] Emergency Assistance 911 [facility] and/or Residential Mobile Crisis Unit telephone number  Request made of family/significant other to: Remove weapons (e.g., guns, rifles, knives), all items previously/currently identified as safety concern.   Remove drugs/medications (over-the-counter, prescriptions, illicit drugs), all items previously/currently identified as a safety concern.  The family member/significant other verbalizes understanding of the suicide prevention education information provided.  The family member/significant other agrees to remove the items of safety concern listed above.  CSW was able to discuss suicide prevention with patient.  Also, provided him with a pamphlet with phone numbers to use if he has any SI in the future.  William Caldwell 06/26/2023, 12:23 PM
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
## 2023-06-26 NOTE — Progress Notes (Signed)
  [facility] Adult Case Management Discharge Plan :  Will you be returning to the same living situation after discharge:  Yes,  with his significant other  (Myia Jones) At discharge, do you have transportation home?: Yes,  his significant other will transport Do you have the ability to pay for your medications: Yes,  is working and has insurance  Release of information consent forms completed and in the chart;  Patient's signature needed at discharge.  Patient to Follow up at:  Follow-up Information     [facility] Follow up.   Why: Referral made Contact information: [address] [place] [address] [phone]         [facility] Follow up on 07/03/2023.   Specialty: Behavioral Health Why: Please go to this provider on 07/03/23 at 2:00 pm for an assessment, to obtain medication management and therapy services. Contact information: [address] [place] [address] [phone]        [place] Go on 06/28/2023.   Specialty: Professional Counselor Why: Please go to this provider for therapy services during walk in hours, go on 06/28/23 at 9:00 am. Contact information: [address] [place] [address] [phone]                 Next level of care provider has access to [facility] Link:no  Safety Planning and Suicide Prevention discussed: Yes,  completed with Patient on 06/26/23     Has patient been referred to the Quitline?: Patient refused referral for treatment  Patient has been referred for addiction treatment: Patient refused referral for treatment.  Marinda Elk, LCSW 06/26/2023, 12:21 PM

## 2023-06-26 NOTE — Group Note (Signed)
 Date:  06/26/2023 Time:  9:21 AM  Group Topic/Focus:  Goals Group:   The focus of this group is to help patients establish daily goals to achieve during treatment and discuss how the patient can incorporate goal setting into their daily lives to aide in recovery.    Participation Level:  Active  Participation Quality:  Appropriate  Affect:  Appropriate   Erasmo Score 06/26/2023, 9:21 AM
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
## 2023-06-26 NOTE — Plan of Care (Signed)
  Problem: Education: Goal: Knowledge of [facility] Education information/materials will improve Outcome: Adequate for Discharge Goal: Emotional status will improve Outcome: Adequate for Discharge Goal: Mental status will improve Outcome: Adequate for Discharge Goal: Verbalization of understanding the information provided will improve Outcome: Adequate for Discharge   

## 2023-07-03 ENCOUNTER — Ambulatory Visit (HOSPITAL_COMMUNITY): Payer: Self-pay | Admitting: Psychiatry

## 2023-07-04 ENCOUNTER — Encounter (HOSPITAL_COMMUNITY): Payer: Self-pay | Admitting: Psychiatry

## 2023-07-04 ENCOUNTER — Ambulatory Visit (INDEPENDENT_AMBULATORY_CARE_PROVIDER_SITE_OTHER): Admitting: Psychiatry

## 2023-07-04 VITALS — BP 133/94 | HR 74 | Temp 98.2°F | Ht 64.0 in | Wt 122.8 lb

## 2023-07-04 DIAGNOSIS — F411 Generalized anxiety disorder: Secondary | ICD-10-CM

## 2023-07-04 DIAGNOSIS — F1220 Cannabis dependence, uncomplicated: Secondary | ICD-10-CM

## 2023-07-04 DIAGNOSIS — F321 Major depressive disorder, single episode, moderate: Secondary | ICD-10-CM | POA: Diagnosis not present

## 2023-07-04 DIAGNOSIS — G2581 Restless legs syndrome: Secondary | ICD-10-CM | POA: Diagnosis not present

## 2023-07-04 DIAGNOSIS — F4310 Post-traumatic stress disorder, unspecified: Secondary | ICD-10-CM

## 2023-07-04 DIAGNOSIS — F17200 Nicotine dependence, unspecified, uncomplicated: Secondary | ICD-10-CM

## 2023-07-04 MED ORDER — TRAZODONE HCL 50 MG PO TABS: 50.0000 mg | ORAL_TABLET | Freq: Every day | ORAL | 3 refills | Status: DC

## 2023-07-04 MED ORDER — NICOTINE 14 MG/24HR TD PT24: 14.0000 mg | MEDICATED_PATCH | Freq: Every day | TRANSDERMAL | 3 refills | Status: DC

## 2023-07-04 MED ORDER — ESCITALOPRAM OXALATE 20 MG PO TABS: 20.0000 mg | ORAL_TABLET | Freq: Every day | ORAL | 3 refills | Status: DC
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
## 2023-07-23 ENCOUNTER — Telehealth: Admitting: Physician Assistant

## 2023-07-23 ENCOUNTER — Telehealth: Admitting: Family Medicine

## 2023-07-23 DIAGNOSIS — R809 Proteinuria, unspecified: Secondary | ICD-10-CM

## 2023-07-23 DIAGNOSIS — M109 Gout, unspecified: Secondary | ICD-10-CM | POA: Diagnosis not present

## 2023-07-23 MED ORDER — INDOMETHACIN 50 MG PO CAPS: 50.0000 mg | ORAL_CAPSULE | Freq: Three times a day (TID) | ORAL | 0 refills | Status: AC

## 2023-07-23 NOTE — Progress Notes (Signed)
E-Visit for Gout Symptoms  We are sorry that you are not feeling well. We are here to help!  Based on what you shared with me it looks like you have a flare of your gout.  Gout is a form of arthritis. It can cause pain and swelling in the joints. At first, it tends to affect only 1 joint - most frequently the big toe. It happens in people who have too much uric acid in the blood. Uric acid is a chemical that is produced when the body breaks down certain foods. Uric acid can form sharp needle-like crystals that build up in the joints and cause pain. Uric acid crystals can also form inside the tubes that carry urine from the kidneys to the bladder. These crystals can turn into "kidney stones" that can cause pain and problems with the flow of urine. People with gout get sudden "flares" or attacks of severe pain, most often the big toe, ankle, or knee. Often the joint also turns red and swells. Usually, only 1 joint is affected, but some people have pain in more than 1 joint. Gout flares tend to happen more often during the night.  The pain from gout can be extreme. The pain and swelling are worst at the beginning of a gout flare. The symptoms then get better within a few days to weeks. It is not clear how the body "turns off" a gout flare.  Do not start any NEW preventative medicine until the gout has cleared completely. However, If you are already on Probenecid or Allopurinol for CHRONIC gout, you may continue taking this during an active flare up  I have prescribed Indomethacin 50mg three times daily for moderate to severe pain for no more than 7 days   HOME CARE Losing weight can help relieve gout. It's not clear that following a specific diet plan will help with gout symptoms but eating a balanced diet can help improve your overall health. It can also help you lose weight, if you are overweight. In general, a healthy diet includes plenty of fruits, vegetables, whole grains, and low-fat dairy  products (labelled "low fat", skim, 2%). Avoid sugar sweetened drinks (including sodas, tea, juice and juice blends, coffee drinks and sports drinks) Limit alcohol to 1-2 drinks of beer, spirits or wine daily these can make gout flares worse. Some people with gout also have other health problems, such as heart disease, high blood pressure, kidney disease, or obesity. If you have any of these issues, it's important to work with your doctor to manage them. This can help improve your overall health and might also help with your gout.  GET HELP RIGHT AWAY IF: Your symptoms persist after you have completed your treatment plan You develop severe diarrhea You develop abnormal sensations  You develop vomiting,   You develop weakness  You develop abdominal pain  FOLLOW UP WITH YOUR PRIMARY PROVIDER IF: If your symptoms do not improve within 10 days  MAKE SURE YOU  Understand these instructions. Will watch your condition. Will get help right away if you are not doing well or get worse.  Thank you for choosing an e-visit.  Your e-visit answers were reviewed by a board certified advanced clinical practitioner to complete your personal care plan. Depending upon the condition, your plan could have included both over the counter or prescription medications.  Please review your pharmacy choice. Make sure the pharmacy is open so you can pick up prescription now. If there is a problem,   you may contact your provider through MyChart messaging and have the prescription routed to another pharmacy.  Your safety is important to us. If you have drug allergies check your prescription carefully.   For the next 24 hours you can use MyChart to ask questions about today's visit, request a non-urgent call back, or ask for a work or school excuse. You will get an email in the next two days asking about your experience. I hope that your e-visit has been valuable and will speed your recovery.    have provided 5 minutes of  non face to face time during this encounter for chart review and documentation.   
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
## 2023-08-23 ENCOUNTER — Encounter (HOSPITAL_COMMUNITY): Payer: Self-pay | Admitting: Licensed Clinical Social Worker

## 2023-08-23 ENCOUNTER — Ambulatory Visit (HOSPITAL_COMMUNITY): Admitting: Licensed Clinical Social Worker

## 2023-08-23 DIAGNOSIS — F1094 Alcohol use, unspecified with alcohol-induced mood disorder: Secondary | ICD-10-CM

## 2023-08-23 DIAGNOSIS — F1220 Cannabis dependence, uncomplicated: Secondary | ICD-10-CM
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
## 2023-09-13 ENCOUNTER — Ambulatory Visit (HOSPITAL_COMMUNITY): Admitting: Psychiatry

## 2023-09-15 ENCOUNTER — Telehealth (INDEPENDENT_AMBULATORY_CARE_PROVIDER_SITE_OTHER): Admitting: Family

## 2023-09-15 DIAGNOSIS — F411 Generalized anxiety disorder: Secondary | ICD-10-CM | POA: Diagnosis not present

## 2023-09-15 DIAGNOSIS — F4310 Post-traumatic stress disorder, unspecified: Secondary | ICD-10-CM

## 2023-09-15 DIAGNOSIS — F321 Major depressive disorder, single episode, moderate: Secondary | ICD-10-CM | POA: Diagnosis not present

## 2023-09-15 MED ORDER — TRAZODONE HCL 50 MG PO TABS: 50.0000 mg | ORAL_TABLET | Freq: Every day | ORAL | 3 refills | Status: DC

## 2023-09-15 MED ORDER — ESCITALOPRAM OXALATE 20 MG PO TABS: 20.0000 mg | ORAL_TABLET | Freq: Every day | ORAL | 3 refills | Status: DC
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
## 2023-09-28 ENCOUNTER — Encounter (HOSPITAL_COMMUNITY): Payer: Self-pay | Admitting: Emergency Medicine

## 2023-09-28 ENCOUNTER — Other Ambulatory Visit: Payer: Self-pay

## 2023-09-28 ENCOUNTER — Emergency Department (HOSPITAL_COMMUNITY)

## 2023-09-28 ENCOUNTER — Emergency Department (HOSPITAL_COMMUNITY)
Admission: EM | Admit: 2023-09-28 | Discharge: 2023-09-28 | Disposition: A | Discharge status: Home / Self Care | Attending: Emergency Medicine | Admitting: Emergency Medicine

## 2023-09-28 DIAGNOSIS — Y92410 Unspecified street and highway as the place of occurrence of the external cause: Secondary | ICD-10-CM | POA: Insufficient documentation

## 2023-09-28 DIAGNOSIS — M545 Low back pain, unspecified: Secondary | ICD-10-CM | POA: Diagnosis not present

## 2023-09-28 DIAGNOSIS — M542 Cervicalgia: Secondary | ICD-10-CM | POA: Insufficient documentation

## 2023-09-28 DIAGNOSIS — R079 Chest pain, unspecified: Secondary | ICD-10-CM | POA: Insufficient documentation

## 2023-09-28 MED ORDER — CYCLOBENZAPRINE HCL 10 MG PO TABS
5.0000 mg | ORAL_TABLET | Freq: Once | ORAL | Status: AC
  Administered 2023-09-28: 5 mg via ORAL
  Filled 2023-09-28: qty 1

## 2023-09-28 NOTE — Discharge Instructions (Addendum)
 This patient left during my assessment, I was unable to speak with patient regarding imaging findings including CT head.  Although imaging findings negative, patient was not educated on these results.  As a result no discharge instructions, return precautions, or outpatient therapies were given to this patient.

## 2023-09-28 NOTE — ED Notes (Signed)
 Pt did not want to wait to hear results. Pt left before PA arrived
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
## 2023-10-06 ENCOUNTER — Ambulatory Visit (INDEPENDENT_AMBULATORY_CARE_PROVIDER_SITE_OTHER): Admitting: Nurse Practitioner

## 2023-10-06 VITALS — BP 119/83 | HR 103 | Temp 98.3°F | Wt 112.0 lb

## 2023-10-06 DIAGNOSIS — R7301 Impaired fasting glucose: Secondary | ICD-10-CM | POA: Diagnosis not present

## 2023-10-06 DIAGNOSIS — R809 Proteinuria, unspecified: Secondary | ICD-10-CM

## 2023-10-06 DIAGNOSIS — R350 Frequency of micturition: Secondary | ICD-10-CM

## 2023-10-06 DIAGNOSIS — Z0000 Encounter for general adult medical examination without abnormal findings: Secondary | ICD-10-CM

## 2023-10-06 LAB — POCT GLYCOSYLATED HEMOGLOBIN (HGB A1C): Hemoglobin A1C: 4.8 % (ref 4.0–5.6)

## 2023-10-06 LAB — POCT URINALYSIS DIP (CLINITEK)
Bilirubin, UA: NEGATIVE
Blood, UA: NEGATIVE
Glucose, UA: NEGATIVE mg/dL
Leukocytes, UA: NEGATIVE
Nitrite, UA: NEGATIVE
POC PROTEIN,UA: 30 — AB
Spec Grav, UA: >=1.03 — AB (ref 1.010–1.025)
Urobilinogen, UA: 2 U/dL — AB
pH, UA: 6 (ref 5.0–8.0)

## 2023-10-06 MED ORDER — MIRTAZAPINE 15 MG PO TBDP: 15.0000 mg | ORAL_TABLET | Freq: Every day | ORAL | 2 refills | Status: DC

## 2023-10-06 NOTE — Addendum Note (Signed)
 Addended by: OLEY BASCOM RAMAN on: 10/06/2023 03:23 PM   Modules accepted: Orders

## 2023-10-06 NOTE — Progress Notes (Addendum)
 Subjective   Patient ID: William Caldwell, male    DOB: 01-03-1996, 28 y.o.   MRN: [id number]  Chief Complaint  Patient presents with   Establish Care    Referring provider: No ref. provider found  William Caldwell is a 28 y.o. male with Past Medical History: No date: Anxiety No date: COPD (chronic obstructive pulmonary disease) (HCC) 07/02/2023: Depression     Comment:  MDD No date: GERD (gastroesophageal reflux disease) No date: Herpes   HPI  Patient presents today to establish care.  He does have a history of alcohol  abuse and polysubstance use.  He states that he has quit all alcohol  and drug use for the past month.  He has tried to turn his life around.  He states that he does not have any appetite now and at times is skipping meals.  We discussed that he does need to eat 6 small meals throughout the day today we did give him samples of Ensure today.  He is losing weight and is down to 112 pounds currently.  We will check labs today.  He is currently seeing a Veterinary surgeon.  Denies f/c/s, n/v/d, hemoptysis, PND, leg swelling Denies chest pain or edema   A1C in office today was 4.8     No Known Allergies  Immunization History  Administered Date(s) Administered   Tdap 03/21/2017    Tobacco History: Social History   Tobacco Use  Smoking Status Every Day   Current packs/day: 0.50   Average packs/day: 0.5 packs/day for 10.0 years (5.0 ttl pk-yrs)   Types: Cigarettes, Cigars  Smokeless Tobacco Never  Tobacco Comments   Pt smoking 5 cigs and 2 black/milds daily    Ready to quit: No Counseling given: Yes Tobacco comments: Pt smoking 5 cigs and 2 black/milds daily    Outpatient Encounter Medications as of 10/06/2023  Medication Sig   escitalopram  (LEXAPRO ) 20 MG tablet Take 1 tablet (20 mg total) by mouth daily.   mirtazapine (REMERON SOL-TAB) 15 MG disintegrating tablet Take 1 tablet (15 mg total) by mouth at bedtime.   traZODone  (DESYREL ) 50 MG tablet Take 1 tablet  (50 mg total) by mouth at bedtime.   nicotine  (NICODERM CQ  - DOSED IN MG/24 HOURS) 14 mg/24hr patch Place 1 patch (14 mg total) onto the skin daily.   No facility-administered encounter medications on file as of 10/06/2023.    Review of Systems  Review of Systems  Constitutional: Negative.   HENT: Negative.    Cardiovascular: Negative.   Gastrointestinal: Negative.   Allergic/Immunologic: Negative.   Neurological: Negative.   Psychiatric/Behavioral: Negative.       Objective:   BP 119/83   Pulse (!) 103   Temp 98.3 F (36.8 C) (Oral)   Wt 112 lb (50.8 kg)   SpO2 98%   BMI 19.22 kg/m   Wt Readings from Last 5 Encounters:  10/06/23 112 lb (50.8 kg)  03/29/23 138 lb 14.2 oz (63 kg)  01/21/20 139 lb (63 kg)  05/01/19 131 lb 12.8 oz (59.8 kg)  12/06/18 128 lb (58.1 kg)     Physical Exam Vitals and nursing note reviewed.  Constitutional:      General: He is not in acute distress.    Appearance: He is well-developed.  Cardiovascular:     Rate and Rhythm: Normal rate and regular rhythm.  Pulmonary:     Effort: Pulmonary effort is normal.     Breath sounds: Normal breath sounds.  Skin:    General: Skin  is warm and dry.  Neurological:     Mental Status: He is alert and oriented to person, place, and time.       Assessment & Plan:   Frequency of urination -     POCT URINALYSIS DIP (CLINITEK)  Impaired fasting glucose -     POCT glycosylated hemoglobin (Hb A1C)  Routine adult health maintenance -     CBC -     Comprehensive metabolic panel with GFR -     Lipid panel  Proteinuria, unspecified type -     Ambulatory referral to Nephrology -     Urine Culture  Other orders -     Mirtazapine; Take 1 tablet (15 mg total) by mouth at bedtime.  Dispense: 30 tablet; Refill: 2     Return in about 4 weeks (around 11/03/2023).   Bascom GORMAN Borer, NP 10/06/2023

## 2023-10-07 LAB — LIPID PANEL
Chol/HDL Ratio: 3.5 ratio (ref 0.0–5.0)
Cholesterol, Total: 208 mg/dL — ABNORMAL HIGH (ref 100–199)
HDL: 59 mg/dL (ref >39)
LDL Chol Calc (NIH): 144 mg/dL — ABNORMAL HIGH (ref 0–99)
Triglycerides: 28 mg/dL (ref 0–149)
VLDL Cholesterol Cal: 5 mg/dL (ref 5–40)

## 2023-10-07 LAB — CBC
Hematocrit: 46.7 % (ref 37.5–51.0)
Hemoglobin: 16.1 g/dL (ref 13.0–17.7)
MCH: 31.9 pg (ref 26.6–33.0)
MCHC: 34.5 g/dL (ref 31.5–35.7)
MCV: 93 fL (ref 79–97)
Platelets: 364 x10E3/uL (ref 150–450)
RBC: 5.05 x10E6/uL (ref 4.14–5.80)
RDW: 12.4 % (ref 11.6–15.4)
WBC: 6.4 x10E3/uL (ref 3.4–10.8)

## 2023-10-07 LAB — COMPREHENSIVE METABOLIC PANEL WITH GFR
ALT: 22 IU/L (ref 0–44)
AST: 26 IU/L (ref 0–40)
Albumin: 4.6 g/dL (ref 4.3–5.2)
Alkaline Phosphatase: 81 IU/L (ref 44–121)
BUN/Creatinine Ratio: 9 (ref 9–20)
BUN: 10 mg/dL (ref 6–20)
Bilirubin Total: 1 mg/dL (ref 0.0–1.2)
CO2: 25 mmol/L (ref 20–29)
Calcium: 10 mg/dL (ref 8.7–10.2)
Chloride: 100 mmol/L (ref 96–106)
Creatinine, Ser: 1.08 mg/dL (ref 0.76–1.27)
Globulin, Total: 2.7 g/dL (ref 1.5–4.5)
Glucose: 64 mg/dL — ABNORMAL LOW (ref 70–99)
Potassium: 4.1 mmol/L (ref 3.5–5.2)
Sodium: 141 mmol/L (ref 134–144)
Total Protein: 7.3 g/dL (ref 6.0–8.5)
eGFR: 96 mL/min/1.73 (ref >59)

## 2023-10-09 ENCOUNTER — Ambulatory Visit: Payer: Self-pay | Admitting: Nurse Practitioner

## 2023-10-10 LAB — URINE CULTURE

## 2023-10-13 ENCOUNTER — Telehealth (HOSPITAL_COMMUNITY): Admitting: Family

## 2023-10-13 ENCOUNTER — Encounter (HOSPITAL_COMMUNITY): Payer: Self-pay | Admitting: Family

## 2023-10-13 DIAGNOSIS — F4310 Post-traumatic stress disorder, unspecified: Secondary | ICD-10-CM

## 2023-10-13 DIAGNOSIS — F321 Major depressive disorder, single episode, moderate: Secondary | ICD-10-CM | POA: Diagnosis not present
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
## 2023-11-10 ENCOUNTER — Encounter: Payer: Self-pay | Admitting: Nurse Practitioner

## 2023-11-10 ENCOUNTER — Ambulatory Visit: Payer: Self-pay | Admitting: Nurse Practitioner

## 2023-11-10 VITALS — BP 136/85 | HR 85 | Temp 97.9°F | Wt 128.8 lb

## 2023-11-10 DIAGNOSIS — R809 Proteinuria, unspecified: Secondary | ICD-10-CM | POA: Diagnosis not present

## 2023-11-10 LAB — POCT URINALYSIS DIP (CLINITEK)
Bilirubin, UA: NEGATIVE
Blood, UA: NEGATIVE
Glucose, UA: NEGATIVE mg/dL
Ketones, POC UA: NEGATIVE mg/dL
Leukocytes, UA: NEGATIVE
Nitrite, UA: NEGATIVE
POC PROTEIN,UA: NEGATIVE
Spec Grav, UA: 1.02 (ref 1.010–1.025)
Urobilinogen, UA: 1 U/dL
pH, UA: 7.5 (ref 5.0–8.0)

## 2023-11-10 NOTE — Progress Notes (Signed)
 Subjective   Patient ID: William Caldwell, male    DOB: June 24, 1995, 28 y.o.   MRN: [id number]  Chief Complaint  Patient presents with   Medical Management of Chronic Issues    Referring provider: No ref. provider found  Braylan Faul is a 28 y.o. male with Past Medical History: No date: Anxiety No date: COPD (chronic obstructive pulmonary disease) (HCC) 07/02/2023: Depression     Comment:  MDD No date: GERD (gastroesophageal reflux disease) No date: Herpes   HPI  Patient presents today for follow-up.  He does have a history of polysubstance abuse and alcohol  use.  He has been trying to quit all of this and is weaning himself off.  He does need a new recheck on urine today.  UA in office today was clear.  Rehab was offered to patient but he declines at this time. Denies f/c/s, n/v/d, hemoptysis, PND, leg swelling Denies chest pain or edema     No Known Allergies  Immunization History  Administered Date(s) Administered   Tdap 03/21/2017    Tobacco History: Social History   Tobacco Use  Smoking Status Every Day   Current packs/day: 0.50   Average packs/day: 0.5 packs/day for 10.0 years (5.0 ttl pk-yrs)   Types: Cigarettes, Cigars  Smokeless Tobacco Never  Tobacco Comments   Pt smoking 5 cigs and 2 black/milds daily    Ready to quit: Not Answered Counseling given: Yes Tobacco comments: Pt smoking 5 cigs and 2 black/milds daily    Outpatient Encounter Medications as of 11/10/2023  Medication Sig   escitalopram  (LEXAPRO ) 20 MG tablet Take 1 tablet (20 mg total) by mouth daily. (Patient not taking: Reported on 11/10/2023)   mirtazapine  (REMERON  SOL-TAB) 15 MG disintegrating tablet Take 1 tablet (15 mg total) by mouth at bedtime. (Patient not taking: Reported on 11/10/2023)   nicotine  (NICODERM CQ  - DOSED IN MG/24 HOURS) 14 mg/24hr patch Place 1 patch (14 mg total) onto the skin daily.   traZODone  (DESYREL ) 50 MG tablet Take 1 tablet (50 mg total) by mouth at bedtime.  (Patient not taking: Reported on 11/10/2023)   No facility-administered encounter medications on file as of 11/10/2023.    Review of Systems  Review of Systems  Constitutional: Negative.   HENT: Negative.    Cardiovascular: Negative.   Gastrointestinal: Negative.   Allergic/Immunologic: Negative.   Neurological: Negative.   Psychiatric/Behavioral: Negative.       Objective:   BP 136/85   Pulse 85   Temp 97.9 F (36.6 C) (Oral)   Wt 128 lb 12.8 oz (58.4 kg)   SpO2 98%   BMI 22.11 kg/m   Wt Readings from Last 5 Encounters:  11/10/23 128 lb 12.8 oz (58.4 kg)  10/06/23 112 lb (50.8 kg)  03/29/23 138 lb 14.2 oz (63 kg)  01/21/20 139 lb (63 kg)  05/01/19 131 lb 12.8 oz (59.8 kg)     Physical Exam Vitals and nursing note reviewed.  Constitutional:      General: He is not in acute distress.    Appearance: He is well-developed.  Cardiovascular:     Rate and Rhythm: Normal rate and regular rhythm.  Pulmonary:     Effort: Pulmonary effort is normal.     Breath sounds: Normal breath sounds.  Skin:    General: Skin is warm and dry.  Neurological:     Mental Status: He is alert and oriented to person, place, and time.       Assessment & Plan:  Proteinuria, unspecified type -     POCT URINALYSIS DIP (CLINITEK)     Return in about 3 months (around 02/10/2024).   Bascom GORMAN Borer, NP 11/10/2023

## 2023-12-14 ENCOUNTER — Encounter (HOSPITAL_COMMUNITY): Payer: Self-pay

## 2023-12-14 ENCOUNTER — Ambulatory Visit (HOSPITAL_COMMUNITY): Admission: EM | Admit: 2023-12-14 | Discharge: 2023-12-14 | Discharge status: Against Medical Advice

## 2023-12-14 ENCOUNTER — Ambulatory Visit (INDEPENDENT_AMBULATORY_CARE_PROVIDER_SITE_OTHER): Admitting: Mental Health

## 2023-12-14 DIAGNOSIS — F321 Major depressive disorder, single episode, moderate: Secondary | ICD-10-CM | POA: Diagnosis not present

## 2023-12-14 DIAGNOSIS — F1210 Cannabis abuse, uncomplicated: Secondary | ICD-10-CM

## 2023-12-14 DIAGNOSIS — F1020 Alcohol dependence, uncomplicated: Secondary | ICD-10-CM

## 2023-12-14 DIAGNOSIS — F4310 Post-traumatic stress disorder, unspecified: Secondary | ICD-10-CM
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
## 2023-12-26 ENCOUNTER — Ambulatory Visit (HOSPITAL_COMMUNITY): Admitting: Licensed Clinical Social Worker

## 2024-01-16 ENCOUNTER — Ambulatory Visit (HOSPITAL_COMMUNITY)
Admission: EM | Admit: 2024-01-16 | Discharge: 2024-01-16 | Disposition: A | Discharge status: Home / Self Care | Attending: Nurse Practitioner | Admitting: Nurse Practitioner

## 2024-01-16 DIAGNOSIS — F308 Other manic episodes: Secondary | ICD-10-CM | POA: Insufficient documentation

## 2024-01-16 DIAGNOSIS — F22 Delusional disorders: Secondary | ICD-10-CM | POA: Insufficient documentation

## 2024-01-16 DIAGNOSIS — Z91148 Patient's other noncompliance with medication regimen for other reason: Secondary | ICD-10-CM | POA: Insufficient documentation

## 2024-01-16 MED ORDER — OLANZAPINE 5 MG PO TABS: 5.0000 mg | ORAL_TABLET | Freq: Every day | ORAL | 0 refills | Status: DC

## 2024-01-16 MED ORDER — MIRTAZAPINE 15 MG PO TBDP: 15.0000 mg | ORAL_TABLET | Freq: Every day | ORAL | 0 refills | Status: DC
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
## 2024-01-16 NOTE — Discharge Instructions (Signed)
 Get help right away if: You have thoughts about hurting yourself or others. Get help right away if you feel like you may hurt yourself or others, or have thoughts about taking your own life. Go to your nearest emergency room or: Call 911. Call the National Suicide Prevention Lifeline at 972-065-6231 or 988 in the [place] This is open 24 hours a day. If you're a Veteran: Call 988 and press 1. This is open 24 hours a day. Text the [employer] at 2181152885. Summary Mental health is not just the absence of mental illness. It involves understanding your emotions and behaviors, and taking steps to manage them in a healthy way. If you have symptoms of mental or emotional distress, get help from family, friends, a health care provider, or a mental health professional. Practice good mental health behaviors such as stress management skills, self-calming skills, exercise, healthy sleeping and eating, and supportive relationships. This information is not intended to replace advice given to you by your health care provider. Make sure you discuss any questions you have with your health care provider.   ducation provided on the fact that if experiencing worsening of psychiatry symptoms including suicidal ideations, homicidal ideations, or having auditory/visual hallucinations, etc, to call 911, 988, come back to this location, or go to the nearest ER. Pt verbalized understanding.

## 2024-01-18 ENCOUNTER — Telehealth (HOSPITAL_COMMUNITY): Payer: Self-pay | Admitting: Licensed Clinical Social Worker

## 2024-01-26 ENCOUNTER — Telehealth (HOSPITAL_COMMUNITY): Admitting: Psychiatry

## 2024-01-26 ENCOUNTER — Encounter (HOSPITAL_COMMUNITY): Payer: Self-pay | Admitting: Psychiatry

## 2024-01-26 DIAGNOSIS — F19951 Other psychoactive substance use, unspecified with psychoactive substance-induced psychotic disorder with hallucinations: Secondary | ICD-10-CM | POA: Diagnosis not present

## 2024-01-26 DIAGNOSIS — F411 Generalized anxiety disorder: Secondary | ICD-10-CM

## 2024-01-26 DIAGNOSIS — F4310 Post-traumatic stress disorder, unspecified: Secondary | ICD-10-CM | POA: Diagnosis not present

## 2024-01-26 MED ORDER — MIRTAZAPINE 15 MG PO TBDP: 15.0000 mg | ORAL_TABLET | Freq: Every day | ORAL | 3 refills | Status: DC

## 2024-01-26 MED ORDER — OLANZAPINE 10 MG PO TABS: 10.0000 mg | ORAL_TABLET | Freq: Every day | ORAL | 3 refills | Status: DC
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
## 2024-02-05 ENCOUNTER — Ambulatory Visit (HOSPITAL_COMMUNITY)
Admission: EM | Admit: 2024-02-05 | Discharge: 2024-02-06 | Disposition: A | Discharge status: Home / Self Care | Attending: Psychiatry | Admitting: Psychiatry

## 2024-02-05 DIAGNOSIS — R251 Tremor, unspecified: Secondary | ICD-10-CM | POA: Insufficient documentation

## 2024-02-05 DIAGNOSIS — F430 Acute stress reaction: Secondary | ICD-10-CM | POA: Insufficient documentation

## 2024-02-05 DIAGNOSIS — F329 Major depressive disorder, single episode, unspecified: Secondary | ICD-10-CM | POA: Diagnosis not present

## 2024-02-05 DIAGNOSIS — F1010 Alcohol abuse, uncomplicated: Secondary | ICD-10-CM | POA: Diagnosis not present

## 2024-02-05 DIAGNOSIS — F411 Generalized anxiety disorder: Secondary | ICD-10-CM | POA: Insufficient documentation

## 2024-02-05 DIAGNOSIS — R112 Nausea with vomiting, unspecified: Secondary | ICD-10-CM | POA: Insufficient documentation

## 2024-02-05 DIAGNOSIS — F15159 Other stimulant abuse with stimulant-induced psychotic disorder, unspecified: Secondary | ICD-10-CM | POA: Insufficient documentation

## 2024-02-05 DIAGNOSIS — F1090 Alcohol use, unspecified, uncomplicated: Secondary | ICD-10-CM

## 2024-02-05 DIAGNOSIS — Z79899 Other long term (current) drug therapy: Secondary | ICD-10-CM | POA: Insufficient documentation

## 2024-02-05 DIAGNOSIS — F19959 Other psychoactive substance use, unspecified with psychoactive substance-induced psychotic disorder, unspecified: Secondary | ICD-10-CM

## 2024-02-05 DIAGNOSIS — R443 Hallucinations, unspecified: Secondary | ICD-10-CM | POA: Insufficient documentation

## 2024-02-05 DIAGNOSIS — F1290 Cannabis use, unspecified, uncomplicated: Secondary | ICD-10-CM | POA: Insufficient documentation

## 2024-02-05 DIAGNOSIS — Z560 Unemployment, unspecified: Secondary | ICD-10-CM | POA: Insufficient documentation

## 2024-02-05 DIAGNOSIS — F4310 Post-traumatic stress disorder, unspecified: Secondary | ICD-10-CM | POA: Insufficient documentation

## 2024-02-05 DIAGNOSIS — R630 Anorexia: Secondary | ICD-10-CM | POA: Insufficient documentation

## 2024-02-05 DIAGNOSIS — F1590 Other stimulant use, unspecified, uncomplicated: Secondary | ICD-10-CM

## 2024-02-05 DIAGNOSIS — R197 Diarrhea, unspecified: Secondary | ICD-10-CM | POA: Insufficient documentation

## 2024-02-05 DIAGNOSIS — Z5902 Unsheltered homelessness: Secondary | ICD-10-CM | POA: Insufficient documentation

## 2024-02-05 LAB — COMPREHENSIVE METABOLIC PANEL WITH GFR
ALT: 29 U/L (ref 0–44)
AST: 24 U/L (ref 15–41)
Albumin: 4.2 g/dL (ref 3.5–5.0)
Alkaline Phosphatase: 64 U/L (ref 38–126)
Anion gap: 7 (ref 5–15)
BUN: 11 mg/dL (ref 6–20)
CO2: 30 mmol/L (ref 22–32)
Calcium: 9.6 mg/dL (ref 8.9–10.3)
Chloride: 99 mmol/L (ref 98–111)
Creatinine, Ser: 0.93 mg/dL (ref 0.61–1.24)
GFR, Estimated: >60 mL/min (ref >60)
Glucose, Bld: 101 mg/dL — ABNORMAL HIGH (ref 70–99)
Potassium: 3.5 mmol/L (ref 3.5–5.1)
Sodium: 136 mmol/L (ref 135–145)
Total Bilirubin: 1.1 mg/dL (ref 0.0–1.2)
Total Protein: 6.9 g/dL (ref 6.5–8.1)

## 2024-02-05 LAB — CBC WITH DIFFERENTIAL/PLATELET
Abs Immature Granulocytes: 0.03 K/uL (ref 0.00–0.07)
Basophils Absolute: 0 K/uL (ref 0.0–0.1)
Basophils Relative: 1 %
Eosinophils Absolute: 0.1 K/uL (ref 0.0–0.5)
Eosinophils Relative: 1 %
HCT: 45.1 % (ref 39.0–52.0)
Hemoglobin: 15.7 g/dL (ref 13.0–17.0)
Immature Granulocytes: 0 %
Lymphocytes Relative: 23 %
Lymphs Abs: 2 K/uL (ref 0.7–4.0)
MCH: 31.2 pg (ref 26.0–34.0)
MCHC: 34.8 g/dL (ref 30.0–36.0)
MCV: 89.5 fL (ref 80.0–100.0)
Monocytes Absolute: 0.5 K/uL (ref 0.1–1.0)
Monocytes Relative: 6 %
Neutro Abs: 5.8 K/uL (ref 1.7–7.7)
Neutrophils Relative %: 69 %
Platelets: 266 K/uL (ref 150–400)
RBC: 5.04 MIL/uL (ref 4.22–5.81)
RDW: 12 % (ref 11.5–15.5)
WBC: 8.5 K/uL (ref 4.0–10.5)
nRBC: 0 % (ref 0.0–0.2)

## 2024-02-05 LAB — POCT URINE DRUG SCREEN - MANUAL ENTRY (I-SCREEN)
POC Amphetamine UR: NOT DETECTED
POC Buprenorphine (BUP): NOT DETECTED
POC Cocaine UR: NOT DETECTED
POC Marijuana UR: POSITIVE — AB
POC Methadone UR: NOT DETECTED
POC Methamphetamine UR: NOT DETECTED
POC Morphine: NOT DETECTED
POC Oxazepam (BZO): NOT DETECTED
POC Oxycodone UR: NOT DETECTED
POC Secobarbital (BAR): NOT DETECTED

## 2024-02-05 LAB — ETHANOL: Alcohol, Ethyl (B): <15 mg/dL (ref <15)

## 2024-02-05 MED ORDER — MAGNESIUM HYDROXIDE 400 MG/5ML PO SUSP: 30.0000 mL | Freq: Every day | ORAL | Status: DC | PRN

## 2024-02-05 MED ORDER — DIPHENHYDRAMINE HCL 50 MG PO CAPS: 50.0000 mg | ORAL_CAPSULE | Freq: Three times a day (TID) | ORAL | Status: DC | PRN

## 2024-02-05 MED ORDER — LORAZEPAM 1 MG PO TABS: 1.0000 mg | ORAL_TABLET | ORAL | Status: DC | PRN

## 2024-02-05 MED ORDER — LORAZEPAM 2 MG/ML IJ SOLN: 2.0000 mg | Freq: Three times a day (TID) | INTRAMUSCULAR | Status: DC | PRN

## 2024-02-05 MED ORDER — THIAMINE HCL 100 MG/ML IJ SOLN: 100.0000 mg | Freq: Every day | INTRAMUSCULAR | Status: DC

## 2024-02-05 MED ORDER — FOLIC ACID 1 MG PO TABS
1.0000 mg | ORAL_TABLET | Freq: Every day | ORAL | Status: DC
  Administered 2024-02-05: 1 mg via ORAL
  Filled 2024-02-05: qty 1

## 2024-02-05 MED ORDER — DIPHENHYDRAMINE HCL 50 MG/ML IJ SOLN
50.0000 mg | Freq: Three times a day (TID) | INTRAMUSCULAR | Status: DC | PRN
Start: 2024-02-05 — End: 2024-02-06

## 2024-02-05 MED ORDER — HYDROXYZINE HCL 25 MG PO TABS: 25.0000 mg | ORAL_TABLET | Freq: Three times a day (TID) | ORAL | Status: DC | PRN

## 2024-02-05 MED ORDER — ALUM & MAG HYDROXIDE-SIMETH 200-200-20 MG/5ML PO SUSP: 30.0000 mL | ORAL | Status: DC | PRN

## 2024-02-05 MED ORDER — OLANZAPINE 5 MG PO TABS
5.0000 mg | ORAL_TABLET | Freq: Every day | ORAL | Status: DC
  Filled 2024-02-05: qty 1

## 2024-02-05 MED ORDER — TRAZODONE HCL 50 MG PO TABS
50.0000 mg | ORAL_TABLET | Freq: Every evening | ORAL | Status: DC | PRN
  Filled 2024-02-05: qty 1

## 2024-02-05 MED ORDER — ADULT MULTIVITAMIN W/MINERALS CH
1.0000 | ORAL_TABLET | Freq: Every day | ORAL | Status: DC
  Administered 2024-02-05: 1 via ORAL
  Filled 2024-02-05: qty 1

## 2024-02-05 MED ORDER — THIAMINE MONONITRATE 100 MG PO TABS
100.0000 mg | ORAL_TABLET | Freq: Every day | ORAL | Status: DC
  Administered 2024-02-05: 100 mg via ORAL
  Filled 2024-02-05: qty 1

## 2024-02-05 MED ORDER — HALOPERIDOL LACTATE 5 MG/ML IJ SOLN: 5.0000 mg | Freq: Three times a day (TID) | INTRAMUSCULAR | Status: DC | PRN

## 2024-02-05 MED ORDER — OLANZAPINE 10 MG PO TABS
10.0000 mg | ORAL_TABLET | Freq: Once | ORAL | Status: AC
  Administered 2024-02-05: 10 mg via ORAL
  Filled 2024-02-05: qty 1

## 2024-02-05 MED ORDER — HALOPERIDOL LACTATE 5 MG/ML IJ SOLN: 10.0000 mg | Freq: Three times a day (TID) | INTRAMUSCULAR | Status: DC | PRN

## 2024-02-05 MED ORDER — ACETAMINOPHEN 325 MG PO TABS: 650.0000 mg | ORAL_TABLET | Freq: Four times a day (QID) | ORAL | Status: DC | PRN

## 2024-02-05 MED ORDER — HALOPERIDOL 5 MG PO TABS
5.0000 mg | ORAL_TABLET | Freq: Three times a day (TID) | ORAL | Status: DC | PRN
Start: 2024-02-05 — End: 2024-02-06
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
## 2024-02-05 NOTE — Progress Notes (Signed)
 Pt is admitted [facility] due substance and alcohol  abuse. Pt is alert and oriented and appears to be preoccupied. Pt is ambulatory and is oriented to staff/unit. Pt was cooperative with labs and skin assessment. Pt denies pain and current SI/HI/AVH, plan or intent. Staff will monitor for pt's safety.
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
## 2024-02-05 NOTE — ED Notes (Addendum)
 Pt received at the begiining of shift.  Resting quietly without complaints for experiencing AVH or SI, ableto agree to inform staff of harmful thoughts. Will continue to monitor for safety.

## 2024-02-06 MED ORDER — FOLIC ACID 1 MG PO TABS: 1.0000 mg | ORAL_TABLET | Freq: Every day | ORAL | 0 refills | Status: AC

## 2024-02-06 MED ORDER — VITAMIN B-1 100 MG PO TABS
100.0000 mg | ORAL_TABLET | Freq: Every day | ORAL | 0 refills | Status: AC
Start: 2024-02-07 — End: ?
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
## 2024-02-06 NOTE — ED Notes (Signed)
 Pt. Sleeping quietly, safety maintained.

## 2024-02-06 NOTE — ED Notes (Signed)
 Pt sleeping, name called 3 times responsive to tactile stimuli, refused  medications.  Will continue to provide safety . Observed to change positions while resting.

## 2024-02-06 NOTE — Discharge Instructions (Addendum)
Patient is instructed prior to discharge to:  Take all medications as prescribed by his/her mental healthcare provider. Report any adverse effects and or reactions from the medicines to his/her outpatient provider promptly. Keep all scheduled appointments, to ensure that you are getting refills on time and to avoid any interruption in your medication.  If you are unable to keep an appointment call to reschedule.  Be sure to follow-up with resources and follow-up appointments provided.  Patient has been instructed & cautioned: To not engage in alcohol and or illegal drug use while on prescription medicines. In the event of worsening symptoms, patient is instructed to call the crisis hotline, 911 and or go to the nearest ED for appropriate evaluation and treatment of symptoms. To follow-up with his/her primary care provider for your other medical issues, concerns and or health care needs.    

## 2024-02-12 ENCOUNTER — Ambulatory Visit: Payer: Self-pay | Admitting: Nurse Practitioner

## 2024-02-20 ENCOUNTER — Emergency Department: Admission: EM | Admit: 2024-02-20 | Discharge: 2024-02-20 | Disposition: A | Discharge status: Home / Self Care

## 2024-02-20 ENCOUNTER — Other Ambulatory Visit: Payer: Self-pay

## 2024-02-20 ENCOUNTER — Emergency Department

## 2024-02-20 DIAGNOSIS — Y900 Blood alcohol level of less than 20 mg/100 ml: Secondary | ICD-10-CM | POA: Insufficient documentation

## 2024-02-20 DIAGNOSIS — R251 Tremor, unspecified: Secondary | ICD-10-CM | POA: Insufficient documentation

## 2024-02-20 DIAGNOSIS — F10130 Alcohol abuse with withdrawal, uncomplicated: Secondary | ICD-10-CM | POA: Diagnosis present

## 2024-02-20 DIAGNOSIS — F10930 Alcohol use, unspecified with withdrawal, uncomplicated: Secondary | ICD-10-CM

## 2024-02-20 LAB — CBC
HCT: 39.5 % (ref 39.0–52.0)
Hemoglobin: 13.8 g/dL (ref 13.0–17.0)
MCH: 31.4 pg (ref 26.0–34.0)
MCHC: 34.9 g/dL (ref 30.0–36.0)
MCV: 89.8 fL (ref 80.0–100.0)
Platelets: 212 K/uL (ref 150–400)
RBC: 4.4 MIL/uL (ref 4.22–5.81)
RDW: 12.2 % (ref 11.5–15.5)
WBC: 5.6 K/uL (ref 4.0–10.5)
nRBC: 0 % (ref 0.0–0.2)

## 2024-02-20 LAB — COMPREHENSIVE METABOLIC PANEL WITH GFR
ALT: 31 U/L (ref 0–44)
AST: 27 U/L (ref 15–41)
Albumin: 4.1 g/dL (ref 3.5–5.0)
Alkaline Phosphatase: 64 U/L (ref 38–126)
Anion gap: 11 (ref 5–15)
BUN: 11 mg/dL (ref 6–20)
CO2: 26 mmol/L (ref 22–32)
Calcium: 8.7 mg/dL — ABNORMAL LOW (ref 8.9–10.3)
Chloride: 104 mmol/L (ref 98–111)
Creatinine, Ser: 0.82 mg/dL (ref 0.61–1.24)
GFR, Estimated: >60 mL/min (ref >60)
Glucose, Bld: 95 mg/dL (ref 70–99)
Potassium: 3.8 mmol/L (ref 3.5–5.1)
Sodium: 141 mmol/L (ref 135–145)
Total Bilirubin: 0.5 mg/dL (ref 0.0–1.2)
Total Protein: 6.2 g/dL — ABNORMAL LOW (ref 6.5–8.1)

## 2024-02-20 LAB — ETHANOL: Alcohol, Ethyl (B): <15 mg/dL (ref <15)

## 2024-02-20 MED ORDER — THIAMINE HCL 100 MG/ML IJ SOLN: 100.0000 mg | Freq: Every day | INTRAMUSCULAR | Status: DC

## 2024-02-20 MED ORDER — FOLIC ACID 1 MG PO TABS: 1.0000 mg | ORAL_TABLET | Freq: Every day | ORAL | Status: DC

## 2024-02-20 MED ORDER — SODIUM CHLORIDE 0.9 % IV BOLUS
1000.0000 mL | Freq: Once | INTRAVENOUS | Status: AC
  Administered 2024-02-20: 1000 mL via INTRAVENOUS

## 2024-02-20 MED ORDER — ADULT MULTIVITAMIN W/MINERALS CH: 1.0000 | ORAL_TABLET | Freq: Every day | ORAL | Status: DC

## 2024-02-20 MED ORDER — LORAZEPAM 1 MG PO TABS: 1.0000 mg | ORAL_TABLET | ORAL | Status: DC | PRN

## 2024-02-20 MED ORDER — CHLORDIAZEPOXIDE HCL 25 MG PO CAPS: ORAL_CAPSULE | ORAL | 0 refills | Status: AC

## 2024-02-20 MED ORDER — THIAMINE MONONITRATE 100 MG PO TABS: 100.0000 mg | ORAL_TABLET | Freq: Every day | ORAL | Status: DC

## 2024-02-20 MED ORDER — LORAZEPAM 2 MG/ML IJ SOLN
1.0000 mg | INTRAMUSCULAR | Status: DC | PRN
  Administered 2024-02-20: 1 mg via INTRAVENOUS
  Filled 2024-02-20: qty 1

## 2024-02-20 NOTE — Discharge Instructions (Addendum)
 You were seen today due to concern of alcohol  withdrawal.  At this time symptoms have fortunately improved.  I have written for a medication that should help with your withdrawal symptoms, please take this as instructed.  Please do not mix this with alcohol .  If you have any worsening of symptoms such as seizures, increased symptoms of withdrawal, or any other symptoms you find concerning please return to the emergency department immediately for further medical management.  I have provided you with contact information for our local rehab facilities, please call them to arrange for follow-up appointment.

## 2024-02-20 NOTE — ED Provider Notes (Signed)
 [facility] Provider Note    Event Date/Time   First MD Initiated Contact with Patient 02/20/24 0142     (approximate)   History   Alcohol  Problem   HPI  William Caldwell is a 28 y.o. male who presents today with concern of alcohol  withdrawal.  He has a history of alcohol  abuse, drinks over a pint of alcohol  a day he also uses ecstasy.  He decided on Wednesday to stop cold turkey, however he states that he may drink 1 beer when he has very severe symptoms of withdrawal.  He tells me that he does drink a total of 2 beers since Wednesday has not been taking any ecstasy.  Yesterday at some point apparently his girlfriend woke him up when she noted him having shaking activity in bed, he is not sure how long this may have lasted, apparently he woke up in tears and crying, but the patient does not recall any of this.  He denies any prior history of seizures, he states in the past his alcohol  withdrawal symptoms have been primarily nausea and diarrhea generally does not have tremors but now having increased tremulous motion.  Denies any trauma to the head, no neurodeficits, he has no other complaints at this time.     Physical Exam   Triage Vital Signs: ED Triage Vitals  Encounter Vitals Group     BP 02/20/24 0144 (!) 124/91     Girls Systolic BP Percentile --      Girls Diastolic BP Percentile --      Boys Systolic BP Percentile --      Boys Diastolic BP Percentile --      Pulse Rate 02/20/24 0144 78     Resp 02/20/24 0144 11     Temp 02/20/24 0144 98.2 F (36.8 C)     Temp Source 02/20/24 0144 Oral     SpO2 02/20/24 0144 94 %     Weight --      Height --      Head Circumference --      Peak Flow --      Pain Score 02/20/24 0142 0     Pain Loc --      Pain Education --      Exclude from Growth Chart --     Most recent vital signs: Vitals:   02/20/24 0200 02/20/24 0517  BP:  121/87  Pulse: 81 84  Resp: 15 18  Temp:  98.3 F (36.8 C)  SpO2:  100%      General: Awake, tremulous CV:  Good peripheral perfusion.  Resp:  Normal effort.  Abd:  No distention.  Soft nontender Neuro:  Appropriate strength and sensation in extremities, tremors in all extremities Other:     ED Results / Procedures / Treatments   Labs (all labs ordered are listed, but only abnormal results are displayed) Labs Reviewed  COMPREHENSIVE METABOLIC PANEL WITH GFR - Abnormal; Notable for the following components:      Result Value   Calcium 8.7 (*)    Total Protein 6.2 (*)    All other components within normal limits  ETHANOL  CBC  URINE DRUG SCREEN     EKG     RADIOLOGY   PROCEDURES:  Critical Care performed: No  Procedures   MEDICATIONS ORDERED IN ED: Medications  LORazepam  (ATIVAN ) tablet 1-4 mg ( Oral See Alternative 02/20/24 0401)    Or  LORazepam  (ATIVAN ) injection 1-4 mg (1 mg Intravenous Given  02/20/24 0401)  thiamine  (VITAMIN B1) tablet 100 mg (has no administration in time range)    Or  thiamine  (VITAMIN B1) injection 100 mg (has no administration in time range)  folic acid  (FOLVITE ) tablet 1 mg (has no administration in time range)  multivitamin with minerals tablet 1 tablet (has no administration in time range)  sodium chloride  0.9 % bolus 1,000 mL (0 mLs Intravenous Stopped 02/20/24 0519)     IMPRESSION / MDM / ASSESSMENT AND PLAN / ED COURSE  I reviewed the triage vital signs and the nursing notes.                               Patient's presentation is most consistent with acute presentation with potential threat to life or bodily function.  28 year old male who presents today with concern of alcohol  withdrawal with questionable seizure-like activity that occurred over 24 hours ago.  Here initial CIWA score of 13, however seemingly he should be outside the window for severe symptoms of delirium tremens.  Will continue treating symptoms of alcohol  withdrawal here will obtain CT imaging of the head, will provide him with  resources for outpatient rehab.  Patient symptoms of withdrawal significantly improved, discussed with him, he agrees with discharge to detox, provided with resources.  Given a Librium  taper for home.  Will have him discharged home at this time.  I discussed return precautions, he is agreeable with the plan.      FINAL CLINICAL IMPRESSION(S) / ED DIAGNOSES   Final diagnoses:  Alcohol  withdrawal syndrome without complication (HCC)     Rx / DC Orders   ED Discharge Orders          Ordered    chlordiazePOXIDE  (LIBRIUM ) 25 MG capsule  Multiple Frequencies        02/20/24 0508             Note:  This document was prepared using Dragon voice recognition software and may include unintentional dictation errors.   Fernand Rossie HERO, MD 02/20/24 [phone]

## 2024-02-20 NOTE — ED Triage Notes (Signed)
 Pt BIB [facility] from home d/t concern for alcohol  withdraw.   Pt reports he typically drinks 1 pint of liquor per day for the past 3 days. Stopped drinking Wednesday last week. Tried to drink a beer today with subsequent vomiting. EMS call by GF tonight d/t concern for seizure like activity. Pt unable to recall event. No injury from seizure.   EMS IV 18G L forearm - 4 mg zofran  given in route   EMS VS 145/88, PR 90, 100% RA, CBG 121.

## 2024-02-20 NOTE — ED Triage Notes (Signed)
 Triage CIWA score 13 - scored 3 for nausea c/o 3 episodes prior to arrival, 7 severe tremors, 1 mild anxiousness, and 2 - mild headache.

## 2024-02-21 NOTE — Progress Notes (Signed)
 [facility] MD Outpatient Progress Note  02/22/2024 9:05 AM Akram Kissick  MRN:  [id number]  Assessment:  Cosette Buhl presents for follow-up evaluation. Today, 02/22/24, patient reports continued psychosocial stressors with limited insights that includes employment issues, isolated currently, substance use that includes marijuana use every day, cigarette smoking however he has tried to stay away from alcohol  and ecstasy over the past 2 weeks.  He has been experiencing depression and anxiety characterized by low energy, irritability, abandonment issues, emotional dysregulation with a likely effect due to substance use affecting his mood.  Through the history there is definitely a pattern between patient's symptoms and substance abuse and likely the reported psychosis/mania is being impacted by substance use.  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  He does have limited insight as about the connection between her symptoms and substance use patterns.  He reported hives on olanzapine  which has led to being noncompliant over the past 2 days on it however he has received significant benefit when it comes to sleep, mood, anxiety, he is amenable to decreasing the dose to 7.5 mg as he had tolerated the 5 mg dose well in the past without any side effects.  Lack of routine is also contributing to his presentation however he is not amenable to starting therapy, IOP program or needing assistance with substance abuse.  His appetite has been better however he received significant benefit for his depressive symptoms when he was on Remeron  in the past.  Plan to restart Remeron  and continue olanzapine  as below.  Will continue to encourage him for therapy in the subsequent visits.  Encouraged continued cessation from alcohol  and ecstasy use.  There are no safety concerns currently, will have him back in the clinic virtually in 8 weeks.  Identifying Information: Nakota Elsen is  a 28 y.o. male with a history of anxiety, MDD, PTSD, substance use who is an established patient with [facility] participating in follow-up via video conferencing.   Risk Assessment: An assessment of suicide and violence risk factors was performed as part of this evaluation and is not significantly changed from the last visit.             While future psychiatric events cannot be accurately predicted, the patient does not currently require acute inpatient psychiatric care and does not currently meet [place]  involuntary commitment criteria.    Plan:  # Substance-induced psychotic disorder (alcohol , ecstasy, marijuana) Past medication trials:  Status of problem: Current Interventions: -- Decrease Zyprexa  to 7.5 mg nightly, patient had hives at 10 mg however tolerated the 5 mg dose well. -- He will take over-the-counter Benadryl  for hives/allergy, discussed the side effects of taking more than the recommended dose.  # Substance use (alcohol , ecstasy, marijuana) # Alcohol  use disorder #MDD Past medication trials:  Status of problem: Current Interventions: -- Start Remeron  15 mg nightly -- Encouraged psychotherapy however the patient is more agreeable currently -- Not agreeable to IOP program as well  # Anxiety/PTSD, likely due to substance abuse Past medication trials:  Status of problem: Current Interventions: -- Start Remeron  15 mg nightly -- Encouraged healthy coping skills  Return to care in 8 weeks  Patient was given contact information for behavioral health clinic and was instructed to call 911 for emergencies.    Patient and plan of care will be discussed with the Attending MD ,Dr. Susen, who agrees with the above statement and plan.   Subjective:  Chief Complaint:  Chief Complaint  Patient presents with   Follow-up   Medication Refill   Anxiety   Alcohol  Problem    Interval History:  Today, patient presented via virtual visit.  He  reported his mood as staying sober .  Reported that he has been off alcohol  and ecstasy for the past 2 weeks and has been trying to quit it.  He denied any withdrawals currently, reported that he has been off Librium  that he was prescribed on his recent visit to the emergency department.  He denied any cravings as well.  He denied any active or passive SI/HI/AVH.  He reported his sleep as great on medications, reported good appetite.  Reported that he had AVH back in August September, likely under the effect of ecstasy and alcohol  but was unable to certainly state a definite answer.  Reported that he has been taking olanzapine  every day, did not take for the past 2 days due to hives .  Reported that he will take Benadryl  for allergy.  He has been on olanzapine  5 mg, denied any side effects on that dose but reported that on 10 mg he had some hives.  He denied any symptoms of mania. When asked about anxiety he stated bad , and depression bad .  Reported that they are due to the stressors I have been out of work, I live alone, financial issues .  He reported that he was previously working at [employer] however currently he is not. He denied using alcohol  or ecstasy currently, reported that he has been smoking CBD 1 g every 2 to 3 days and smoking cigarettes 5-6 every day it has increased recently due to the stressors . We discussed about decreasing olanzapine  to 7.5 mg nightly until the next appointment and starting Remeron  15 mg for depression/sleep as he has tolerated that in the past with benefit.  Reported that his depression was much better when he was on Remeron .  Prescription was sent to the preferred pharmacy, plan to follow-up with him in 8 weeks.  Visit Diagnosis:    ICD-10-CM   1. Substance-induced psychotic disorder with hallucinations (HCC)  F19.951 OLANZapine  (ZYPREXA ) 5 MG tablet    2. Generalized anxiety disorder  F41.1 mirtazapine  (REMERON ) 15 MG tablet    3. Major depressive  disorder, single episode, moderate (HCC)  F32.1 mirtazapine  (REMERON ) 15 MG tablet    4. 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 PTSD (post-traumatic stress disorder)  F43.10       Past Psychiatric History:  Diagnoses: Depression, anxiety, PTSD, ecstasy, tobacco, marijuana, alcohol  use disorder, substance-induced psychotic disorder Medication trials: Zyprexa , Remeron  Previous psychiatrist/therapist: Zane FORBES Bach NP Hospitalizations: Multiple hospitalizations for substance use Suicide attempts: Denies SIB: Denies Hx of violence towards others: Denies Current access to guns: Denies Hx of trauma/abuse: Denies Substance use: Alcohol , ecstasy, marijuana  Past Medical History:  Past Medical History:  Diagnosis Date   Anxiety    COPD (chronic obstructive pulmonary disease) (HCC)    Depression 07/02/2023   MDD   GERD (gastroesophageal reflux disease)    Herpes     Past Surgical History:  Procedure Laterality Date   LAPAROSCOPY N/A 03/29/2023   Procedure: LAPAROSCOPY DIAGNOSTIC;  Surgeon: Lyndel Deward PARAS, MD;  Location: [facility] OR;  Service: General;  Laterality: N/A;   LAPAROTOMY N/A 03/29/2023   Procedure: EXPLORATORY LAPAROTOMY;  Surgeon: Lyndel Deward PARAS, MD;  Location: [facility] OR;  Service:  General;  Laterality: N/A;    Family Psychiatric History: Nothing significant  Family History: No family history on file.  Social History: Academic/Vocational:  Social History   Socioeconomic History   Marital status: Single    Spouse name: Not on file   Number of children: Not on file   Years of education: Not on file   Highest education level: GED or equivalent  Occupational History   Not on file  Tobacco Use   Smoking status: Every Day    Current packs/day: 0.50    Average packs/day: 0.5 packs/day for 10.0 years (5.0 ttl pk-yrs)    Types: Cigarettes, Cigars   Smokeless tobacco: Never   Tobacco comments:     Pt smoking 5 cigs and 2 black/milds daily   Vaping Use   Vaping status: Never Used  Substance and Sexual Activity   Alcohol  use: Yes    Comment: almost daily 750 ml every other daily   Drug use: Yes    Types: Marijuana, Amphetamines, Benzodiazepines    Comment: 2-3 blunts today. Hx of other drugs listed.   Sexual activity: Yes    Birth control/protection: Condom, None  Other Topics Concern   Not on file  Social History Narrative   Not on file   Social Drivers of Health   Financial Resource Strain: Medium Risk (10/06/2023)   Overall Financial Resource Strain (CARDIA)    Difficulty of Paying Living Expenses: Somewhat hard  Food Insecurity: No Food Insecurity (02/05/2024)   Hunger Vital Sign    Worried About Running Out of Food in the Last Year: Never true    Ran Out of Food in the Last Year: Never true  Transportation Needs: No Transportation Needs (02/05/2024)   PRAPARE - Administrator, Civil Service (Medical): No    Lack of Transportation (Non-Medical): No  Physical Activity: Inactive (10/06/2023)   Exercise Vital Sign    Days of Exercise per Week: 0 days    Minutes of Exercise per Session: Not on file  Stress: Stress Concern Present (10/06/2023)   [employer] of Occupational Health - Occupational Stress Questionnaire    Feeling of Stress: To some extent  Social Connections: Moderately Isolated (10/06/2023)   Social Connection and Isolation Panel    Frequency of Communication with Friends and Family: Three times a week    Frequency of Social Gatherings with Friends and Family: Twice a week    Attends Religious Services: Never    Database Administrator or Organizations: No    Attends Engineer, Structural: Not on file    Marital Status: Living with partner    Allergies: No Known Allergies  Current Medications: Current Outpatient Medications  Medication Sig Dispense Refill   mirtazapine  (REMERON ) 15 MG tablet Take 1 tablet (15 mg total) by mouth  at bedtime. 30 tablet 1   chlordiazePOXIDE  (LIBRIUM ) 25 MG capsule Take 1 capsule (25 mg total) by mouth 3 (three) times daily for 1 day, THEN 1 capsule (25 mg total) in the morning and at bedtime for 1 day, THEN 1 capsule (25 mg total) daily for 1 day. 6 capsule 0   folic acid  (FOLVITE ) 1 MG tablet Take 1 tablet (1 mg total) by mouth daily. 30 tablet 0   OLANZapine  (ZYPREXA ) 5 MG tablet Take 1.5 tablets (7.5 mg total) by mouth at bedtime. 45 tablet 1   thiamine  (VITAMIN B-1) 100 MG tablet Take 1 tablet (100 mg total) by mouth daily. 30 tablet 0  No current facility-administered medications for this visit.    ROS: Review of Systems  Constitutional:  Negative for activity change, appetite change, chills, diaphoresis and fatigue.  HENT:  Negative for congestion, dental problem, drooling, ear discharge and ear pain.   Eyes:  Negative for pain, discharge and itching.  Respiratory:  Negative for apnea, cough, choking and chest tightness.   Cardiovascular:  Negative for chest pain, palpitations and leg swelling.  Gastrointestinal:  Negative for abdominal distention, abdominal pain, constipation, diarrhea and nausea.  Endocrine: Negative for cold intolerance and heat intolerance.  Genitourinary:  Negative for difficulty urinating, dysuria, flank pain, frequency, hematuria and urgency.  Musculoskeletal:  Negative for arthralgias, back pain, gait problem, joint swelling, myalgias and neck pain.  Skin:  Negative for color change and pallor.  Allergic/Immunologic: Negative for environmental allergies and food allergies.  Neurological:  Negative for dizziness, seizures, syncope, facial asymmetry, speech difficulty, light-headedness, numbness and headaches.  Psychiatric/Behavioral:  Negative for agitation, behavioral problems, confusion, decreased concentration, dysphoric mood, hallucinations, self-injury, sleep disturbance and suicidal ideas. The patient is not nervous/anxious and is not hyperactive.      Objective:  Psychiatric Specialty Exam: There were no vitals taken for this visit.There is no height or weight on file to calculate BMI.  General Appearance: Casual and Fairly Groomed  Eye Contact:  Good  Speech:  Clear and Coherent  Volume:  Normal  Mood:  Euthymic  Affect:  Appropriate  Thought Content: Logical   Suicidal Thoughts:  No  Homicidal Thoughts:  No  Thought Process:  Linear  Orientation:  Full (Time, Place, and Person)    Memory:  Immediate;   Good Recent;   Good Remote;   Good  Judgment:  Fair  Insight:  Fair  Concentration:  Concentration: Good and Attention Span: Good  Recall:  not formally assessed   Fund of Knowledge: Good  Language: Good  Psychomotor Activity:  Normal  Akathisia:  No  AIMS (if indicated): not done  Assets:  Communication Skills Desire for Improvement Financial Resources/Insurance Housing  ADL's:  Intact  Cognition: WNL  Sleep:  Fair   PE: General: sits comfortably in view of camera; no acute distress Pulm: no increased work of breathing on room air MSK: all extremity movements appear intact Neuro: no focal neurological deficits observed Gait & Station: unable to assess by video   Metabolic Disorder Labs: Lab Results  Component Value Date   HGBA1C 4.8 10/06/2023   MPG 76.71 06/22/2023   MPG 88.19 03/31/2023   No results found for: PROLACTIN Lab Results  Component Value Date   CHOL 208 (H) 10/06/2023   TRIG 28 10/06/2023   HDL 59 10/06/2023   CHOLHDL 3.5 10/06/2023   VLDL 8 06/22/2023   LDLCALC 144 (H) 10/06/2023   LDLCALC 129 (H) 06/22/2023   Lab Results  Component Value Date   TSH 2.029 06/22/2023    Therapeutic Level Labs: No results found for: LITHIUM No results found for: VALPROATE No results found for: CBMZ  Screenings:  AUDIT    Flowsheet Row Video Visit from 01/26/2024 in [facility] Counselor from 08/23/2023 in [facility] Office  Visit from 07/04/2023 in [facility] (Dis[facility]  Alcohol  Use Disorder Identification Test Final Score (AUDIT) 31 36 38 23   CAGE-AID    Flowsheet Row ED to Hosp-Admission (Discharged) from 03/29/2023 in [facility]  CAGE-AID Score 4   GAD-7    Flowsheet Row Video Visit from 01/26/2024 in [facility] Counselor from 08/23/2023 in [facility] Office Visit from 07/04/2023 in [facility] Video Visit from 04/02/2021 in [facility] Video Visit from 02/03/2021 in [facility]  Total GAD-7 Score 21 17 17 19 21    PHQ2-9    Flowsheet Row Video Visit from 01/26/2024 in [facility] Counselor from 08/23/2023 in [facility] Office Visit from 07/04/2023 in [facility] Video Visit from 04/02/2021 in [facility] Video Visit from 02/03/2021 in [facility]  PHQ-2 Total Score 6 6 4 5 4   PHQ-9 Total Score 24 19 16 21 20    Flowsheet Row ED from 02/20/2024 in [facility] Emergency Department at [facility] ED from 02/05/2024 in [facility] Video Visit from 01/26/2024 in [facility]  C-SSRS RISK CATEGORY No Risk No Risk Error: Q7 should not be populated when Q6 is No    Collaboration of Care: Collaboration of Care: Medication Management AEB Dr, Natalia, ED notes  Patient/Guardian was advised Release of Information must be obtained prior to any record release in order to collaborate their care with an outside provider. Patient/Guardian was advised if they have not already done so to contact the registration department to sign all necessary forms in  order for us  to release information regarding their care.   Consent: Patient/Guardian gives verbal consent for treatment and assignment of benefits for services provided during this visit. Patient/Guardian expressed understanding and agreed to proceed.   Televisit via video: I connected with patient on 02/22/24 at  8:30 AM EST by a video enabled telemedicine application and verified that I am speaking with the correct person using two identifiers.  Location: Patient: Home Provider: Office   I discussed the limitations of evaluation and management by telemedicine and the availability of in person appointments. The patient expressed understanding and agreed to proceed.  I discussed the assessment and treatment plan with the patient. The patient was provided an opportunity to ask questions and all were answered. The patient agreed with the plan and demonstrated an understanding of the instructions.   The patient was advised to call back or seek an in-person evaluation if the symptoms worsen or if the condition fails to improve as anticipated.  Riki Berninger, MD 02/22/2024, 9:05 AM

## 2024-02-22 ENCOUNTER — Telehealth (INDEPENDENT_AMBULATORY_CARE_PROVIDER_SITE_OTHER)

## 2024-02-22 ENCOUNTER — Encounter (HOSPITAL_COMMUNITY): Payer: Self-pay

## 2024-02-22 DIAGNOSIS — F4310 Post-traumatic stress disorder, unspecified: Secondary | ICD-10-CM | POA: Diagnosis not present

## 2024-02-22 DIAGNOSIS — F19951 Other psychoactive substance use, unspecified with psychoactive substance-induced psychotic disorder with hallucinations: Secondary | ICD-10-CM | POA: Diagnosis not present

## 2024-02-22 DIAGNOSIS — F321 Major depressive disorder, single episode, moderate: Secondary | ICD-10-CM | POA: Diagnosis not present

## 2024-02-22 DIAGNOSIS — F1094 Alcohol use, unspecified with alcohol-induced mood disorder: Secondary | ICD-10-CM | POA: Diagnosis not present

## 2024-02-22 DIAGNOSIS — F411 Generalized anxiety disorder: Secondary | ICD-10-CM | POA: Diagnosis not present

## 2024-02-22 MED ORDER — MIRTAZAPINE 15 MG PO TABS: 15.0000 mg | ORAL_TABLET | Freq: Every day | ORAL | 1 refills | Status: AC

## 2024-02-22 MED ORDER — OLANZAPINE 5 MG PO TABS: 7.5000 mg | ORAL_TABLET | Freq: Every day | ORAL | 1 refills | Status: AC

## 2024-03-15 ENCOUNTER — Telehealth: Payer: Self-pay | Admitting: Nurse Practitioner

## 2024-03-15 ENCOUNTER — Ambulatory Visit: Payer: Self-pay | Admitting: Nurse Practitioner

## 2024-03-15 NOTE — Telephone Encounter (Signed)
 error

## 2024-04-04 NOTE — Progress Notes (Signed)
 Patient had an appointment today at 1 pm, Waited for the patient until 1:15 pm, he did not connect. Current appointment was marked as a no show.

## 2024-04-18 ENCOUNTER — Encounter (HOSPITAL_COMMUNITY)
